# Patient Record
Sex: Male | Born: 1945 | Race: Black or African American | Hispanic: No | Smoking: Current every day smoker
Health system: Southern US, Community
[De-identification: ages and names within clinical notes are randomized; demographics above are authoritative.]

---

## 2007-08-27 ENCOUNTER — Emergency Department: Payer: Self-pay | Admitting: Emergency Medicine

## 2011-06-15 ENCOUNTER — Emergency Department: Payer: Self-pay | Admitting: Internal Medicine

## 2012-11-05 ENCOUNTER — Emergency Department: Payer: Self-pay | Admitting: Emergency Medicine

## 2012-12-01 ENCOUNTER — Emergency Department: Payer: Self-pay

## 2014-06-13 IMAGING — CT CT CERVICAL SPINE WITHOUT CONTRAST
1 series · 12 of 14 positions shown, 15 images · non-contrast
Comparison: none

REASON FOR EXAM: fall with +etoh
COMMENTS:

[Series 5: axial · axial · 0.33mm/px · z∈[+196,+347]mm · 12 of 108 slices shown, 15 images]
[im 9/108  soft-tissue]
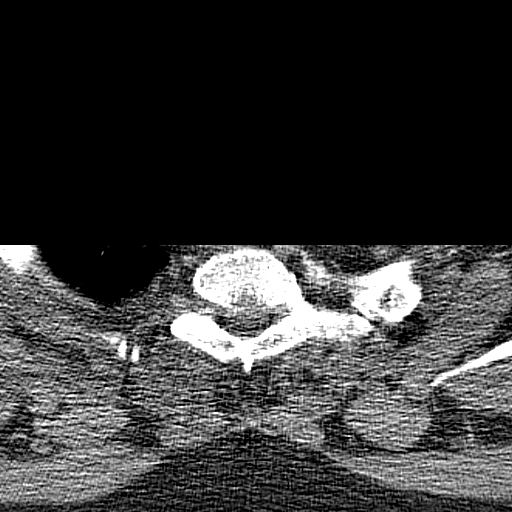
[im 9/108  bone]
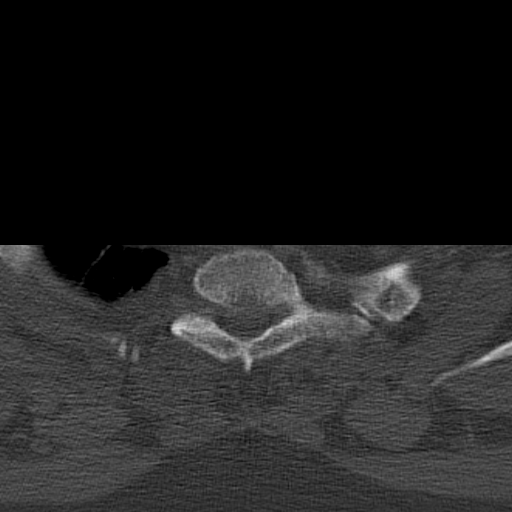
[im 17/108  bone]
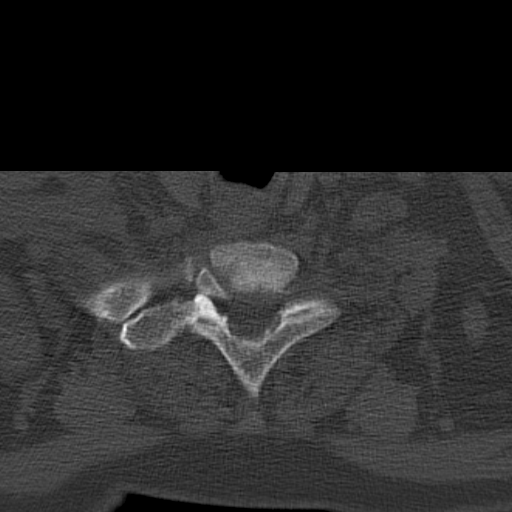
[im 25/108  bone]
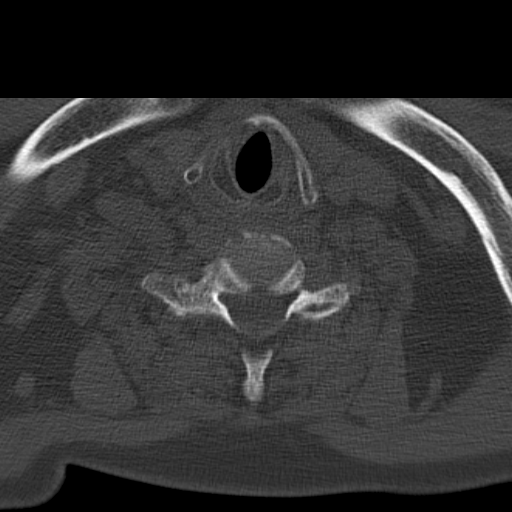
[im 33/108  bone]
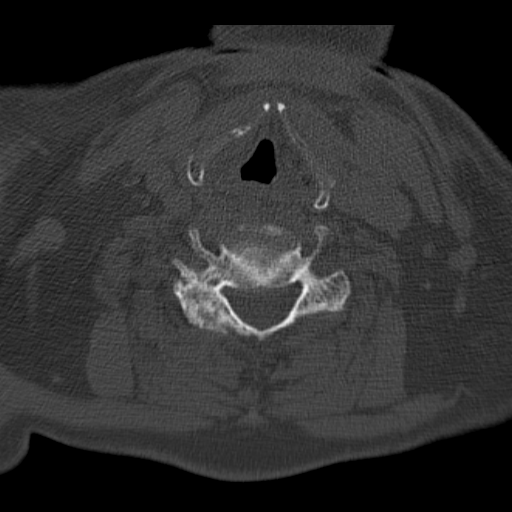
[im 42/108  soft-tissue]
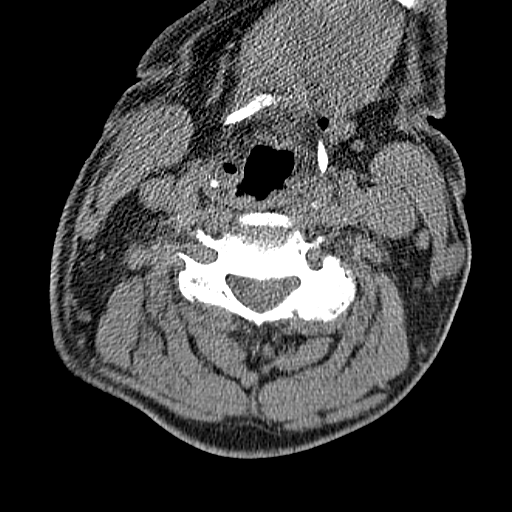
[im 42/108  bone]
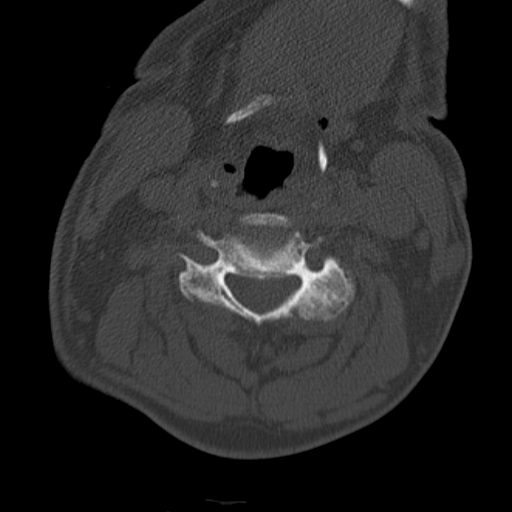
[im 50/108  bone]
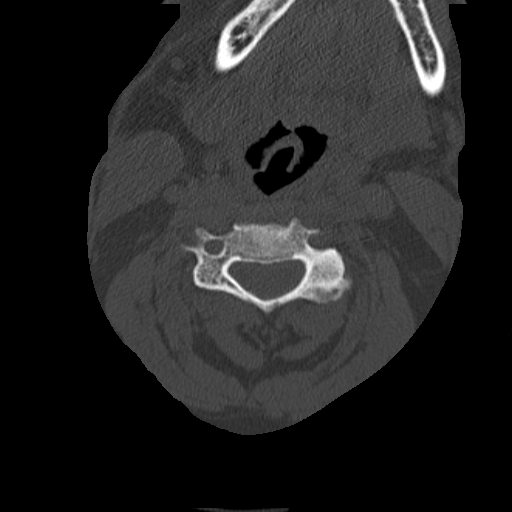
[im 58/108  bone]
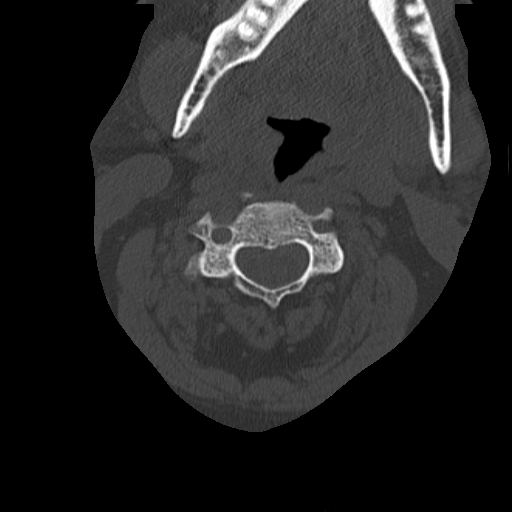
[im 66/108  bone]
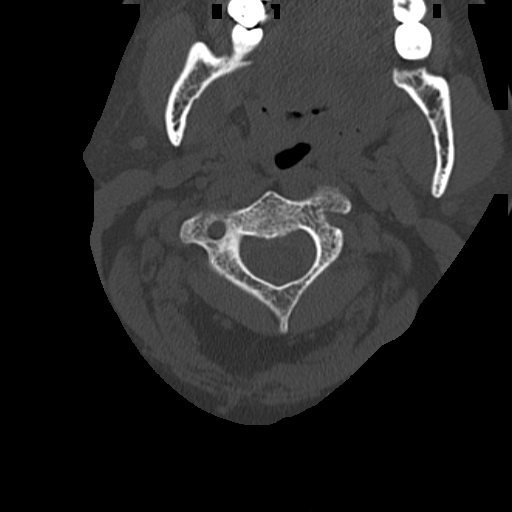
[im 75/108  soft-tissue]
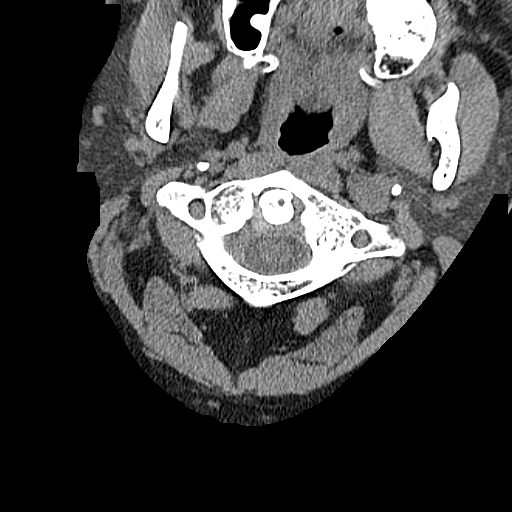
[im 75/108  bone]
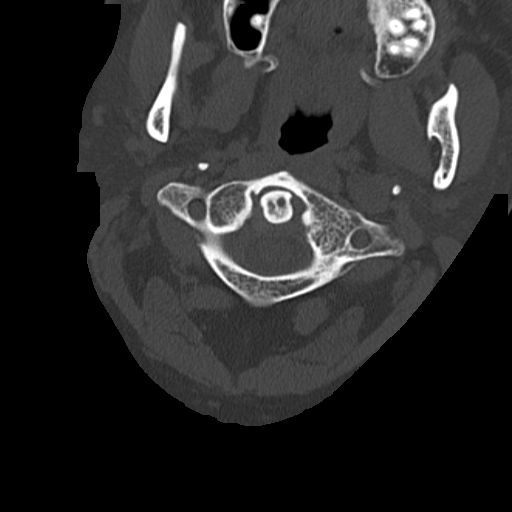
[im 83/108  bone]
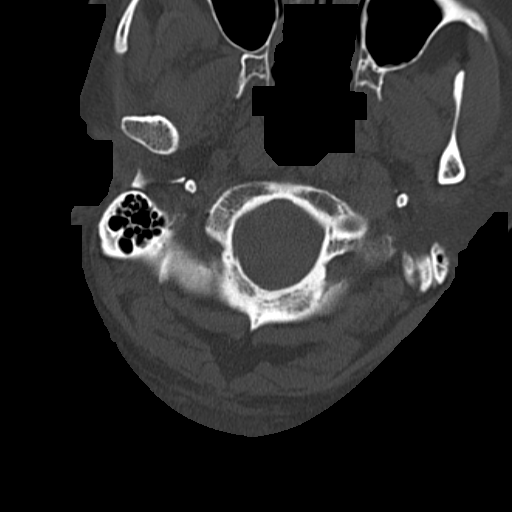
[im 91/108  bone]
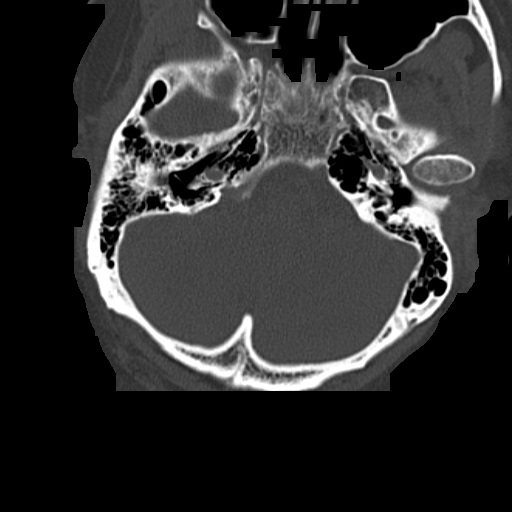
[im 99/108  bone]
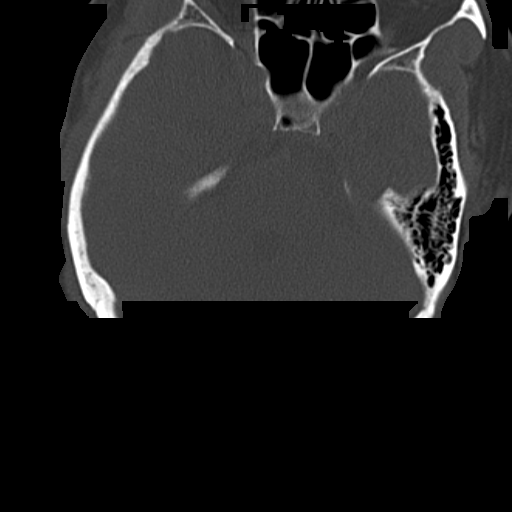

[12 of 14 positions shown; findings below may reference images not displayed]

PROCEDURE:     CT  - CT CERVICAL SPINE WO  - November 05, 2012  [DATE]

RESULT:     Multislice helical acquisition through the cervical spine is
reconstructed at 2 mm slice thickness in the axial, coronal and sagittal
planes at bone window setting. The patient has no previous similar study for
comparison.

The spinal alignment shows slight flexion of the cervical spine mild diffuse
degenerative changes including facet arthropathy including C4-C5 bilaterally
with some mild hypertrophic degenerative endplate spurring but no fracture,
dislocation or foreign body. The base of the calvarium appears to be
unremarkable.
IMPRESSION: No acute cervical spine bony abnormality. Mild degenerative
changes are present.

[REDACTED]

## 2016-05-08 ENCOUNTER — Telehealth: Payer: Self-pay

## 2017-10-04 ENCOUNTER — Emergency Department: Admission: EM | Admit: 2017-10-04 | Discharge: 2017-10-04 | Discharge status: Against Medical Advice | Payer: Self-pay

## 2017-10-04 NOTE — ED Notes (Signed)
Patient to stat desk reporting chest pain for several hours.  Taken to triage 3 and EKG performed.

## 2017-10-04 NOTE — ED Notes (Signed)
While awaiting triage patient states he does not want to seen and walked out.

## 2021-01-12 ENCOUNTER — Emergency Department: Payer: Medicare HMO

## 2021-01-12 ENCOUNTER — Emergency Department
Admission: EM | Admit: 2021-01-12 | Discharge: 2021-01-12 | Disposition: A | Discharge status: Home / Self Care | Payer: Medicare HMO | Attending: Emergency Medicine | Admitting: Emergency Medicine

## 2021-01-12 ENCOUNTER — Other Ambulatory Visit: Payer: Self-pay

## 2021-01-12 DIAGNOSIS — I700 Atherosclerosis of aorta: Secondary | ICD-10-CM | POA: Diagnosis not present

## 2021-01-12 DIAGNOSIS — J441 Chronic obstructive pulmonary disease with (acute) exacerbation: Secondary | ICD-10-CM | POA: Diagnosis not present

## 2021-01-12 DIAGNOSIS — J439 Emphysema, unspecified: Secondary | ICD-10-CM | POA: Diagnosis not present

## 2021-01-12 DIAGNOSIS — S2242XA Multiple fractures of ribs, left side, initial encounter for closed fracture: Secondary | ICD-10-CM | POA: Diagnosis not present

## 2021-01-12 DIAGNOSIS — F17210 Nicotine dependence, cigarettes, uncomplicated: Secondary | ICD-10-CM | POA: Diagnosis not present

## 2021-01-12 DIAGNOSIS — S299XXA Unspecified injury of thorax, initial encounter: Secondary | ICD-10-CM | POA: Diagnosis present

## 2021-01-12 DIAGNOSIS — R079 Chest pain, unspecified: Secondary | ICD-10-CM | POA: Diagnosis not present

## 2021-01-12 DIAGNOSIS — R0789 Other chest pain: Secondary | ICD-10-CM | POA: Diagnosis not present

## 2021-01-12 DIAGNOSIS — J189 Pneumonia, unspecified organism: Secondary | ICD-10-CM | POA: Diagnosis not present

## 2021-01-12 DIAGNOSIS — J9811 Atelectasis: Secondary | ICD-10-CM | POA: Diagnosis not present

## 2021-01-12 DIAGNOSIS — W19XXXA Unspecified fall, initial encounter: Secondary | ICD-10-CM

## 2021-01-12 DIAGNOSIS — R059 Cough, unspecified: Secondary | ICD-10-CM | POA: Insufficient documentation

## 2021-01-12 DIAGNOSIS — W010XXA Fall on same level from slipping, tripping and stumbling without subsequent striking against object, initial encounter: Secondary | ICD-10-CM | POA: Diagnosis not present

## 2021-01-12 DIAGNOSIS — R0781 Pleurodynia: Secondary | ICD-10-CM | POA: Diagnosis not present

## 2021-01-12 LAB — CBC
HCT: 42.4 % (ref 39.0–52.0)
Hemoglobin: 15 g/dL (ref 13.0–17.0)
MCH: 34.4 pg — ABNORMAL HIGH (ref 26.0–34.0)
MCHC: 35.4 g/dL (ref 30.0–36.0)
MCV: 97.2 fL (ref 80.0–100.0)
Platelets: 202 10*3/uL (ref 150–400)
RBC: 4.36 MIL/uL (ref 4.22–5.81)
RDW: 14.1 % (ref 11.5–15.5)
WBC: 9.9 10*3/uL (ref 4.0–10.5)
nRBC: 0 % (ref 0.0–0.2)

## 2021-01-12 LAB — TROPONIN I (HIGH SENSITIVITY)
Troponin I (High Sensitivity): 14 ng/L (ref <18)
Troponin I (High Sensitivity): 13 ng/L (ref <18)

## 2021-01-12 LAB — BASIC METABOLIC PANEL
Anion gap: 9 (ref 5–15)
BUN: 15 mg/dL (ref 8–23)
CO2: 23 mmol/L (ref 22–32)
Calcium: 8.2 mg/dL — ABNORMAL LOW (ref 8.9–10.3)
Chloride: 105 mmol/L (ref 98–111)
Creatinine, Ser: 0.59 mg/dL — ABNORMAL LOW (ref 0.61–1.24)
GFR, Estimated: >60 mL/min (ref >60)
Glucose, Bld: 104 mg/dL — ABNORMAL HIGH (ref 70–99)
Potassium: 3.4 mmol/L — ABNORMAL LOW (ref 3.5–5.1)
Sodium: 137 mmol/L (ref 135–145)

## 2021-01-12 MED ORDER — OXYCODONE HCL 5 MG PO TABS
5.0000 mg | ORAL_TABLET | Freq: Once | ORAL | Status: AC
  Administered 2021-01-12: 10:00:00 5 mg via ORAL
  Filled 2021-01-12: qty 1

## 2021-01-12 MED ORDER — ALBUTEROL SULFATE HFA 108 (90 BASE) MCG/ACT IN AERS: 2.0000 | INHALATION_SPRAY | Freq: Four times a day (QID) | RESPIRATORY_TRACT | 2 refills | Status: DC | PRN

## 2021-01-12 MED ORDER — DOXYCYCLINE HYCLATE 100 MG PO TABS
100.0000 mg | ORAL_TABLET | Freq: Once | ORAL | Status: AC
  Administered 2021-01-12: 11:00:00 100 mg via ORAL
  Filled 2021-01-12: qty 1

## 2021-01-12 MED ORDER — IPRATROPIUM-ALBUTEROL 0.5-2.5 (3) MG/3ML IN SOLN
3.0000 mL | Freq: Once | RESPIRATORY_TRACT | Status: AC
  Administered 2021-01-12: 11:00:00 3 mL via RESPIRATORY_TRACT
  Filled 2021-01-12: qty 3

## 2021-01-12 MED ORDER — PREDNISONE 20 MG PO TABS
60.0000 mg | ORAL_TABLET | Freq: Once | ORAL | Status: AC
  Administered 2021-01-12: 11:00:00 60 mg via ORAL
  Filled 2021-01-12: qty 3

## 2021-01-12 MED ORDER — DOXYCYCLINE HYCLATE 100 MG PO TABS: 100.0000 mg | ORAL_TABLET | Freq: Two times a day (BID) | ORAL | 0 refills | Status: AC

## 2021-01-12 MED ORDER — LIDOCAINE 5 % EX PTCH: 1.0000 | MEDICATED_PATCH | Freq: Two times a day (BID) | CUTANEOUS | 0 refills | Status: AC

## 2021-01-12 MED ORDER — METHOCARBAMOL 500 MG PO TABS: 500.0000 mg | ORAL_TABLET | Freq: Four times a day (QID) | ORAL | 0 refills | Status: DC | PRN

## 2021-01-12 MED ORDER — PREDNISONE 50 MG PO TABS: 50.0000 mg | ORAL_TABLET | Freq: Every day | ORAL | 0 refills | Status: AC

## 2021-01-12 MED ORDER — ACETAMINOPHEN 500 MG PO TABS
1000.0000 mg | ORAL_TABLET | Freq: Once | ORAL | Status: AC
  Administered 2021-01-12: 10:00:00 1000 mg via ORAL
  Filled 2021-01-12: qty 2

## 2021-01-12 NOTE — Discharge Instructions (Addendum)
You are being discharged with a prescription for doxycycline antibiotic to take 2 times daily for the next 5 days.   Prednisone steroids to take once daily for the next 4 days, to finish a 5-day course.  We gave you the dose for today already, start this tomorrow on Monday.  Albuterol inhaler to use as needed for any wheezing or shortness of breath.  Use 1 or 2 puffs at a time, every 4-6 hours.  For pain medicine, use the below regimen: Use Tylenol for pain and fevers.  Up to 1000 mg per dose, up to 4 times per day.  Do not take more than 4000 mg of Tylenol/acetaminophen within 24 hours..  Lidocaine patches.  Apply 1 patch at a time, leave on for 12 hours, remove for 12 hours prior to applying the next one.  Alternating on for 12 hours, off for 12 hours.  Methocarbamol/Robaxin muscle relaxers to use as needed on top of all of this for further severe pain.  Follow-up with your PCP in the next week to ensure you are healing appropriately.  Return to the ED with any worsening symptoms despite these measures.

## 2021-01-12 NOTE — ED Triage Notes (Addendum)
pt arrived to ed via pov with c/o cough starting Friday night. Pt reports having left sided rib pain when coughing. Denies sob, fever, n/v/d. NAD noted

## 2021-01-12 NOTE — ED Notes (Signed)
Pt taught how to use incentive spirometer  

## 2021-01-12 NOTE — ED Notes (Signed)
Patient transported to X-ray 

## 2021-01-12 NOTE — ED Provider Notes (Signed)
[facility] Emergency Department Provider Note ____________________________________________   Event Date/Time   First MD Initiated Contact with Patient 01/12/21 0840     (approximate)  I have reviewed the triage vital signs and the nursing notes.  HISTORY  Chief Complaint Cough and rib pain when coughing   HPI Jordan Washington is a 75 y.o. malewho presents to the ED for evaluation of rib pain and coughing.   Chart review indicates no relevant hx.   Patient presents to the ED for evaluation of left-sided rib/chest pain, cough.  Patient reports a mechanical fall, getting tripped up on his feet, that occurred 3 days ago.  He reports he fell onto his left side.  Denies syncope or head trauma, and reports he has been able to get up and get around since then, but with increasing pain to his left flank and chest wall.  He reports increased cough and increased sputum from baseline.  He reports cutting back on his cigarette smoking, but still smoking.  Who presents to the ED due to poor sleeping due to his left-sided chest wall pain.  History reviewed. No pertinent past medical history.  There are no problems to display for this patient.   History reviewed. No pertinent surgical history.  Prior to Admission medications   Medication Sig Start Date End Date Taking? Authorizing Provider  albuterol (VENTOLIN HFA) 108 (90 Base) MCG/ACT inhaler Inhale 2 puffs into the lungs every 6 (six) hours as needed for wheezing or shortness of breath. 01/12/21  Yes Delton Prairie, MD  doxycycline (VIBRA-TABS) 100 MG tablet Take 1 tablet (100 mg total) by mouth 2 (two) times daily for 5 days. 01/12/21 01/17/21 Yes Delton Prairie, MD  lidocaine (LIDODERM) 5 % Place 1 patch onto the skin every 12 (twelve) hours. Remove & Discard patch within 12 hours or as directed by MD 01/12/21 01/12/22 Yes Delton Prairie, MD  methocarbamol (ROBAXIN) 500 MG tablet Take 1 tablet (500 mg total) by mouth every 6  (six) hours as needed for muscle spasms (pain). 01/12/21  Yes Delton Prairie, MD  predniSONE (DELTASONE) 50 MG tablet Take 1 tablet (50 mg total) by mouth daily for 4 days. 01/12/21 01/16/21 Yes Delton Prairie, MD    Allergies Patient has no allergy information on record.  History reviewed. No pertinent family history.  Social History Social History   Tobacco Use   Smoking status: Every Day    Pack years: 0.00    Types: Cigarettes   Smokeless tobacco: Never  Substance Use Topics   Alcohol use: Yes    Alcohol/week: 2.0 standard drinks    Types: 2 Shots of liquor per week   Drug use: Never    Review of Systems  Constitutional: No fever/chills Eyes: No visual changes. ENT: No sore throat. Cardiovascular: Positive for traumatic left-sided chest pain. Respiratory: Positive for productive cough and shortness of breath. Gastrointestinal: No abdominal pain.  No nausea, no vomiting.  No diarrhea.  No constipation. Genitourinary: Negative for dysuria. Musculoskeletal: Negative for back pain. Skin: Negative for rash. Neurological: Negative for headaches, focal weakness or numbness.  ____________________________________________   PHYSICAL EXAM:  VITAL SIGNS: Vitals:   01/12/21 1100 01/12/21 1200  BP: (!) 137/107 (!) 154/114  Pulse: 74 75  Resp: 18 18  Temp:    SpO2: 96% 96%      Constitutional: Alert and oriented. Well appearing and in no acute distress. Eyes: Conjunctivae are normal. PERRL. EOMI. Head: Atraumatic. Nose: No congestion/rhinnorhea. Mouth/Throat: Mucous membranes  are moist.  Oropharynx non-erythematous. Neck: No stridor. No cervical spine tenderness to palpation. Cardiovascular: Normal rate, regular rhythm. Grossly normal heart sounds.  Good peripheral circulation. Respiratory: Minimal tachypnea to the low 20s, no further evidence of distress.  Diffuse and scattered expiratory wheezes with good improvement throughout. Gastrointestinal: Soft , nondistended,  nontender to palpation. No CVA tenderness. Musculoskeletal: No lower extremity tenderness nor edema.  No joint effusions.  Tenderness to palpation to the left-sided lateral chest wall without bony step-offs or external signs of trauma.  No evidence of open injury. No signs of trauma to the extremities. Neurologic:  Normal speech and language. No gross focal neurologic deficits are appreciated. No gait instability noted. Skin:  Skin is warm, dry and intact. No rash noted. Psychiatric: Mood and affect are normal. Speech and behavior are normal.  ____________________________________________   LABS (all labs ordered are listed, but only abnormal results are displayed)  Labs Reviewed  BASIC METABOLIC PANEL - Abnormal; Notable for the following components:      Result Value   Potassium 3.4 (*)    Glucose, Bld 104 (*)    Creatinine, Ser 0.59 (*)    Calcium 8.2 (*)    All other components within normal limits  CBC - Abnormal; Notable for the following components:   MCH 34.4 (*)    All other components within normal limits  TROPONIN I (HIGH SENSITIVITY)  TROPONIN I (HIGH SENSITIVITY)   ____________________________________________  12 Lead EKG  A. fib, rate of 101 bpm.  Normal axis and intervals.  No STEMI. ____________________________________________  RADIOLOGY  ED MD interpretation: 2 view CXR reviewed by me with streaky opacities to the left base.  Official radiology report(s): DG Chest 2 View  Result Date: 01/12/2021 CLINICAL DATA:  Left-sided rib pain with coughing EXAM: CHEST - 2 VIEW COMPARISON:  None. FINDINGS: Mild opacity overlapping the spine on the lateral view. No edema, effusion, or pneumothorax. Borderline heart size. Aortic tortuosity. IMPRESSION: 1. Mild left lower lobe infiltrate, suspicious for pneumonia. Recommend follow-up radiograph after any treatment. 2. No visible rib fracture. Electronically Signed   By: Marnee SpringJonathon  Watts M.D.   On: 01/12/2021 09:15   CT Chest  Wo Contrast  Result Date: 01/12/2021 CLINICAL DATA:  Fall with LEFT-sided rib pain. EXAM: CT CHEST WITHOUT CONTRAST TECHNIQUE: Multidetector CT imaging of the chest was performed following the standard protocol without IV contrast. COMPARISON:  None FINDINGS: Cardiovascular: Calcification of LEFT coronary circulation. Heart size mild to moderately enlarged. No pericardial effusion. Normal caliber of central pulmonary vessels. Ascending thoracic aorta, mildly dilated. Proximal arch proximally 3.7 cm. Descending thoracic aorta with smooth contours. Limited assessment of cardiovascular structures given lack of intravenous contrast. Mediastinum/Nodes: No adenopathy in the chest. Lungs/Pleura: Signs of pulmonary emphysema, centrilobular and worse at the upper lobes. Basilar atelectasis. Some subpleural reticulation, ground-glass and septal thickening may also be present at the lung bases. Some material in lower lobe bronchi particularly on the RIGHT. Scarring in the lingula. Upper Abdomen: Incidental imaging of upper abdominal contents with signs of hepatic steatosis and lobular hepatic contours. No acute findings relative to visualized liver, gallbladder, pancreas, spleen adrenal glands. Musculoskeletal: Mild spinal degenerative changes. Fractures of ribs 3 through 6 on the LEFT. Sternum is intact. IMPRESSION: 1. Minimally displaced LEFT sided rib fractures without pneumothorax. 2. Basilar atelectasis mixed with ground-glass subpleural reticulation with material in lower lobe bronchi. Findings could represent a combination of atelectasis and aspiration related changes. 3. Mild to moderate cardiomegaly. 4. Calcification of LEFT  coronary circulation. 5. Hepatic steatosis and lobular hepatic contours. Correlate with any clinical or laboratory evidence of liver disease. 6. Emphysema and aortic atherosclerosis. Aortic Atherosclerosis (ICD10-I70.0) and Emphysema (ICD10-J43.9). Electronically Signed   By: Donzetta Kohut M.D.    On: 01/12/2021 10:28    ____________________________________________   PROCEDURES and INTERVENTIONS  Procedure(s) performed (including Critical Care):  .1-3 Lead EKG Interpretation  Date/Time: 01/12/2021 1:31 PM Performed by: Delton Prairie, MD Authorized by: Delton Prairie, MD    Medications  acetaminophen (TYLENOL) tablet 1,000 mg (1,000 mg Oral Given 01/12/21 0943)  oxyCODONE (Oxy IR/ROXICODONE) immediate release tablet 5 mg (5 mg Oral Given 01/12/21 0943)  doxycycline (VIBRA-TABS) tablet 100 mg (100 mg Oral Given 01/12/21 1111)  predniSONE (DELTASONE) tablet 60 mg (60 mg Oral Given 01/12/21 1111)  ipratropium-albuterol (DUONEB) 0.5-2.5 (3) MG/3ML nebulizer solution 3 mL (3 mLs Nebulization Given 01/12/21 1112)    ____________________________________________   MDM / ED COURSE   75 year old smoker presents to the ED with chest pain or shortness of breath, likely due to COP exacerbation and rib fractures, ultimately amenable to trial of outpatient management.  Normal vitals on room air.  The patient with minimal tachypnea and expiratory wheezes, but no distress and largely looks pretty well.  He is tender to palpation over his left lateral ribs, no overlying skin changes, evidence of open injury or herpes zoster.  X-ray with possible basilar infiltrate, and of his increased sputum production from baseline, they would benefit from a short course of antibiotics and he was started on a course of doxycycline.  Improving symptoms after DuoNeb and steroids.  CT chest confirms for nondisplaced broken ribs in the left after his fall.  He is pulling 1500 on incentive spirometry and is very eager to go home.  I see no barriers to trial of outpatient management.  Will discharge with prescriptions for symptomatic control and we discussed return precautions for the ED.  Clinical Course as of 01/12/21 1331  Sun Jan 12, 2021  1105 Reassessed.  Patient reports slightly improving pain after oral analgesia.   We discussed rib fractures, COPD exacerbation and infiltration with increased sputum production.  We discussed breathing treatments, steroids and antibiotics.  He reports that he is very eager to go home and does not want to stay in the hospital.  I think it is reasonable to attempt a trial of outpatient management if we can get him feeling better. [DS]  1218 Reassessed.  Patient sitting up on the side of the bed and looks well.  His son is at the bedside.  He reports he is eager to go home.  We discussed home care with inhaler, steroids for his COPD, we discussed doxycycline for pneumonia and increased sputum production, we discussed incentive spirometry for his rib fractures and management of pain. [DS]    Clinical Course User Index [DS] Delton Prairie, MD    ____________________________________________   FINAL CLINICAL IMPRESSION(S) / ED DIAGNOSES  Final diagnoses:  Closed fracture of multiple ribs of left side, initial encounter  COPD exacerbation (HCC)  Fall, initial encounter     ED Discharge Orders          Ordered    albuterol (VENTOLIN HFA) 108 (90 Base) MCG/ACT inhaler  Every 6 hours PRN        01/12/21 1222    predniSONE (DELTASONE) 50 MG tablet  Daily        01/12/21 1222    doxycycline (VIBRA-TABS) 100 MG tablet  2 times daily  01/12/21 1222    lidocaine (LIDODERM) 5 %  Every 12 hours        01/12/21 1222    methocarbamol (ROBAXIN) 500 MG tablet  Every 6 hours PRN        01/12/21 1222             Kaisyn Reinhold   Note:  This document was prepared using Conservation officer, historic buildings and may include unintentional dictation errors.    Delton Prairie, MD 01/12/21 [phone]

## 2021-01-12 NOTE — ED Notes (Signed)
Patient transported to CT 

## 2022-03-01 ENCOUNTER — Other Ambulatory Visit: Payer: Self-pay

## 2022-03-01 ENCOUNTER — Observation Stay
Admission: EM | Admit: 2022-03-01 | Discharge: 2022-03-02 | Discharge status: Against Medical Advice | Payer: No Typology Code available for payment source | Attending: Emergency Medicine | Admitting: Emergency Medicine

## 2022-03-01 ENCOUNTER — Encounter: Payer: Self-pay | Admitting: Radiology

## 2022-03-01 ENCOUNTER — Emergency Department: Payer: No Typology Code available for payment source

## 2022-03-01 DIAGNOSIS — Z720 Tobacco use: Secondary | ICD-10-CM

## 2022-03-01 DIAGNOSIS — E876 Hypokalemia: Secondary | ICD-10-CM | POA: Insufficient documentation

## 2022-03-01 DIAGNOSIS — I10 Essential (primary) hypertension: Secondary | ICD-10-CM | POA: Insufficient documentation

## 2022-03-01 DIAGNOSIS — F10929 Alcohol use, unspecified with intoxication, unspecified: Secondary | ICD-10-CM

## 2022-03-01 DIAGNOSIS — R4182 Altered mental status, unspecified: Secondary | ICD-10-CM

## 2022-03-01 DIAGNOSIS — R410 Disorientation, unspecified: Secondary | ICD-10-CM | POA: Diagnosis not present

## 2022-03-01 DIAGNOSIS — I639 Cerebral infarction, unspecified: Principal | ICD-10-CM | POA: Insufficient documentation

## 2022-03-01 DIAGNOSIS — I69920 Aphasia following unspecified cerebrovascular disease: Secondary | ICD-10-CM | POA: Diagnosis not present

## 2022-03-01 DIAGNOSIS — G4733 Obstructive sleep apnea (adult) (pediatric): Secondary | ICD-10-CM

## 2022-03-01 DIAGNOSIS — R479 Unspecified speech disturbances: Secondary | ICD-10-CM | POA: Diagnosis not present

## 2022-03-01 DIAGNOSIS — Z20822 Contact with and (suspected) exposure to covid-19: Secondary | ICD-10-CM | POA: Diagnosis not present

## 2022-03-01 DIAGNOSIS — F17210 Nicotine dependence, cigarettes, uncomplicated: Secondary | ICD-10-CM | POA: Insufficient documentation

## 2022-03-01 DIAGNOSIS — I63341 Cerebral infarction due to thrombosis of right cerebellar artery: Secondary | ICD-10-CM

## 2022-03-01 LAB — CBC
HCT: 45.3 % (ref 39.0–52.0)
Hemoglobin: 15 g/dL (ref 13.0–17.0)
MCH: 31.6 pg (ref 26.0–34.0)
MCHC: 33.1 g/dL (ref 30.0–36.0)
MCV: 95.6 fL (ref 80.0–100.0)
Platelets: 147 10*3/uL — ABNORMAL LOW (ref 150–400)
RBC: 4.74 MIL/uL (ref 4.22–5.81)
RDW: 13.8 % (ref 11.5–15.5)
WBC: 8.4 10*3/uL (ref 4.0–10.5)
nRBC: 0 % (ref 0.0–0.2)

## 2022-03-01 LAB — CBC WITH DIFFERENTIAL/PLATELET
Abs Immature Granulocytes: 0.02 10*3/uL (ref 0.00–0.07)
Basophils Absolute: 0 10*3/uL (ref 0.0–0.1)
Basophils Relative: 0 %
Eosinophils Absolute: 0 10*3/uL (ref 0.0–0.5)
Eosinophils Relative: 1 %
HCT: 42.8 % (ref 39.0–52.0)
Hemoglobin: 14.3 g/dL (ref 13.0–17.0)
Immature Granulocytes: 0 %
Lymphocytes Relative: 27 %
Lymphs Abs: 2 10*3/uL (ref 0.7–4.0)
MCH: 32.1 pg (ref 26.0–34.0)
MCHC: 33.4 g/dL (ref 30.0–36.0)
MCV: 96 fL (ref 80.0–100.0)
Monocytes Absolute: 0.7 10*3/uL (ref 0.1–1.0)
Monocytes Relative: 10 %
Neutro Abs: 4.5 10*3/uL (ref 1.7–7.7)
Neutrophils Relative %: 62 %
Platelets: 124 10*3/uL — ABNORMAL LOW (ref 150–400)
RBC: 4.46 MIL/uL (ref 4.22–5.81)
RDW: 13.8 % (ref 11.5–15.5)
WBC: 7.2 10*3/uL (ref 4.0–10.5)
nRBC: 0 % (ref 0.0–0.2)

## 2022-03-01 LAB — COMPREHENSIVE METABOLIC PANEL WITH GFR
ALT: 63 U/L — ABNORMAL HIGH (ref 0–44)
AST: 83 U/L — ABNORMAL HIGH (ref 15–41)
Albumin: 3.5 g/dL (ref 3.5–5.0)
Alkaline Phosphatase: 64 U/L (ref 38–126)
Anion gap: 9 (ref 5–15)
BUN: 13 mg/dL (ref 8–23)
CO2: 21 mmol/L — ABNORMAL LOW (ref 22–32)
Calcium: 8.5 mg/dL — ABNORMAL LOW (ref 8.9–10.3)
Chloride: 108 mmol/L (ref 98–111)
Creatinine, Ser: 0.65 mg/dL (ref 0.61–1.24)
GFR, Estimated: >60 mL/min
Glucose, Bld: 91 mg/dL (ref 70–99)
Potassium: 3.1 mmol/L — ABNORMAL LOW (ref 3.5–5.1)
Sodium: 138 mmol/L (ref 135–145)
Total Bilirubin: 1.1 mg/dL (ref 0.3–1.2)
Total Protein: 9.3 g/dL — ABNORMAL HIGH (ref 6.5–8.1)

## 2022-03-01 LAB — URINALYSIS, ROUTINE W REFLEX MICROSCOPIC
Glucose, UA: NEGATIVE mg/dL
Hgb urine dipstick: NEGATIVE
Ketones, ur: NEGATIVE mg/dL
Leukocytes,Ua: NEGATIVE
Nitrite: NEGATIVE
Protein, ur: 30 mg/dL — AB
Specific Gravity, Urine: 1.026 (ref 1.005–1.030)
pH: 5 (ref 5.0–8.0)

## 2022-03-01 LAB — URINE DRUG SCREEN, QUALITATIVE (ARMC ONLY)
Amphetamines, Ur Screen: NOT DETECTED
Barbiturates, Ur Screen: NOT DETECTED
Benzodiazepine, Ur Scrn: NOT DETECTED
Cannabinoid 50 Ng, Ur ~~LOC~~: NOT DETECTED
Cocaine Metabolite,Ur ~~LOC~~: NOT DETECTED
MDMA (Ecstasy)Ur Screen: NOT DETECTED
Methadone Scn, Ur: NOT DETECTED
Opiate, Ur Screen: NOT DETECTED
Phencyclidine (PCP) Ur S: NOT DETECTED
Tricyclic, Ur Screen: NOT DETECTED

## 2022-03-01 LAB — PROTIME-INR
INR: 1.1 (ref 0.8–1.2)
Prothrombin Time: 13.8 seconds (ref 11.4–15.2)

## 2022-03-01 LAB — ETHANOL: Alcohol, Ethyl (B): 93 mg/dL — ABNORMAL HIGH (ref <10)

## 2022-03-01 LAB — APTT: aPTT: 30 seconds (ref 24–36)

## 2022-03-01 MED ORDER — POTASSIUM CHLORIDE CRYS ER 20 MEQ PO TBCR
40.0000 meq | EXTENDED_RELEASE_TABLET | Freq: Once | ORAL | Status: AC
  Administered 2022-03-01: 40 meq via ORAL
  Filled 2022-03-01: qty 2

## 2022-03-01 NOTE — ED Triage Notes (Signed)
Pt via POV from home. Family here pt has been more confusing and forgetting stuff, family thinks he may have a UTI. States this has been going on for the past week. Pt also c/o L lower dental pain. Pt is A&OX4 and NAD

## 2022-03-01 NOTE — ED Provider Notes (Signed)
[facility] Emergency Department Provider Note     Event Date/Time   First MD Initiated Contact with Patient 03/01/22 1958     (approximate)   History   Altered Mental Status   HPI  Jordan Washington is a 76 y.o. male who according to review of the patient's [facility] summary via care everywhere, has a history of essential hypertension, alcohol disorder, posttraumatic stress disorder, hepatitis C, GERD, and obstructive sleep apnea, presents to the ED out of concern voiced by his adult niece and nephew.  Patient lives independently, and according to family members, over the last two weeks has seemed to have some increased confusion and altered mental status.  They specifically describe him having episodes of word searching, as well as not making sense on the telephone, including slurred speech.  The patient would himself endorse feeling strange for at least the last week as well.  The patient admits to being a current everyday smoker and drinker, but is unable to quantify how much alcohol he takes.  His primary complaint today has been some acute pain to a chronically broken left lower molar.  He denies any fevers, chills, sweats, chest pain, shortness of breath.  He also denies any numbness, tingling, or paralysis.  He does admit to a fall at home couple days ago but denies any serious head injury related to it.  The family present the patient to the ED with concern for a possible UTI or urosepsis.  Patient however, lives independently and has no history of urinary incontinence or mobility issues which would otherwise raise a concern for urosepsis.  Physical Exam   Triage Vital Signs: ED Triage Vitals  Enc Vitals Group     BP 03/01/22 1836 120/83     Pulse Rate 03/01/22 1836 70     Resp 03/01/22 1836 18     Temp 03/01/22 1836 98.3 F (36.8 C)     Temp Source 03/01/22 1836 Oral     SpO2 03/01/22 1836 94 %     Weight 03/01/22 1834 280 lb (127 kg)      Height 03/01/22 1834 6\' 1"  (1.854 m)     Head Circumference --      Peak Flow --      Pain Score 03/01/22 1834 10     Pain Loc --      Pain Edu? --      Excl. in GC? --     Most recent vital signs: Vitals:   03/01/22 1836  BP: 120/83  Pulse: 70  Resp: 18  Temp: 98.3 F (36.8 C)  SpO2: 94%    General Awake, no distress. NAD. Alert to person, place, and time.  HEENT NCAT. PERRL. EOMI. No rhinorrhea. Mucous membranes are moist.  Uvula is midline tonsils are flat.  Patient with a chronically fractured left lower second molar without any surrounding erythema or induration.  No brawny sublingual edema is noted. CV:  Good peripheral perfusion.  RESP:  Normal effort.  ABD:  No distention. Soft, nontender. Mild hepatomegaly.  Rigidity, guarding, or rebound.  Normal bowel sounds appreciated. NEURO: CN II- grossly intact.    ED Results / Procedures / Treatments   Labs (all labs ordered are listed, but only abnormal results are displayed) Labs Reviewed  COMPREHENSIVE METABOLIC PANEL - Abnormal; Notable for the following components:      Result Value   Potassium 3.1 (*)    CO2 21 (*)    Calcium  8.5 (*)    Total Protein 9.3 (*)    AST 83 (*)    ALT 63 (*)    All other components within normal limits  CBC - Abnormal; Notable for the following components:   Platelets 147 (*)    All other components within normal limits  URINALYSIS, ROUTINE W REFLEX MICROSCOPIC - Abnormal; Notable for the following components:   Color, Urine AMBER (*)    APPearance HAZY (*)    Bilirubin Urine SMALL (*)    Protein, ur 30 (*)    Bacteria, UA RARE (*)    All other components within normal limits  ETHANOL - Abnormal; Notable for the following components:   Alcohol, Ethyl (B) 93 (*)    All other components within normal limits  CBC WITH DIFFERENTIAL/PLATELET - Abnormal; Notable for the following components:   Platelets 124 (*)    All other components within normal limits  RESP PANEL BY RT-PCR (FLU  A&B, COVID) ARPGX2  URINE DRUG SCREEN, QUALITATIVE ([facility] ONLY)  PROTIME-INR  APTT  DIFFERENTIAL  CBG MONITORING, ED     EKG  Vent. rate 70 BPM PR interval * ms QRS duration 88 ms QT/QTcB 448/483 ms P-R-T axes 174 57 30  RADIOLOGY  I personally viewed and evaluated these images as part of my medical decision making, as well as reviewing the written report by the radiologist.  ED Provider Interpretation: age indeterminate infarcts  CT HEAD WO CONTRAST ( )  Result Date: 03/01/2022 CLINICAL DATA:  Altered mental status EXAM: CT HEAD WITHOUT CONTRAST TECHNIQUE: Contiguous axial images were obtained from the base of the skull through the vertex without intravenous contrast. RADIATION DOSE REDUCTION: This exam was performed according to the departmental dose-optimization program which includes automated exposure control, adjustment of the mA and/or kV according to patient size and/or use of iterative reconstruction technique. COMPARISON:  11/05/2012 FINDINGS: Brain: No signs of bleeding within the cranium are noted. There is interval appearance of low attenuation in left temporoparietal and left occipital cortex. Cortical sulci are prominent. There is decreased density in periventricular white matter. There is no significant focal mass effect. Vascular: Unremarkable. Skull: Unremarkable. Sinuses/Orbits: Unremarkable. Other: None. IMPRESSION: There is interval appearance of moderate to large area of decreased density in the left temporoparietal cortex. There is new small area of low attenuation in the left occipital cortex. 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 Follow-up MRI as clinically warranted should be considered. There are no signs of bleeding within the cranium. Atrophy. Small-vessel disease. Electronically Signed   By: Ernie Avena M.D.   On: 03/01/2022 21:06     PROCEDURES:  Critical Care performed: No  Procedures   MEDICATIONS ORDERED IN ED: Medications   potassium chloride SA (KLOR-CON M) CR tablet 40 mEq (40 mEq Oral Given 03/01/22 2049)     IMPRESSION / MDM / ASSESSMENT AND PLAN / ED COURSE  I reviewed the triage vital signs and the nursing notes.                              Differential diagnosis includes, but is not limited to, alcohol, illicit or prescription medications, or other toxic ingestion; intracranial pathology such as stroke or intracerebral hemorrhage; fever or infectious causes including sepsis; hypoxemia and/or hypercarbia; uremia; trauma; endocrine related disorders such as diabetes, hypoglycemia, and thyroid-related diseases; hypertensive encephalopathy; etc.  Patient's presentation is most consistent with acute complicated illness / injury requiring diagnostic workup.  ----------------------------------------- 11:39 PM  on 03/01/2022 ----------------------------------------- Transferred care of this patient to my attending Dr. Lenard Lance, he will follow the MRI reports, and dispo the patient accordingly.    FINAL CLINICAL IMPRESSION(S) / ED DIAGNOSES   Final diagnoses:  Altered mental status, unspecified altered mental status type     Rx / DC Orders   ED Discharge Orders     None        Note:  This document was prepared using Dragon voice recognition software and may include unintentional dictation errors.    Lissa Hoard, PA-C 03/02/22 0001    Minna Antis, MD 03/02/22 0430

## 2022-03-02 ENCOUNTER — Observation Stay: Admit: 2022-03-02 | Payer: No Typology Code available for payment source

## 2022-03-02 ENCOUNTER — Encounter: Payer: Self-pay | Admitting: Physician Assistant

## 2022-03-02 ENCOUNTER — Other Ambulatory Visit: Payer: Self-pay | Admitting: Neurology

## 2022-03-02 DIAGNOSIS — R410 Disorientation, unspecified: Secondary | ICD-10-CM | POA: Diagnosis not present

## 2022-03-02 DIAGNOSIS — E876 Hypokalemia: Secondary | ICD-10-CM

## 2022-03-02 DIAGNOSIS — I671 Cerebral aneurysm, nonruptured: Secondary | ICD-10-CM | POA: Diagnosis not present

## 2022-03-02 DIAGNOSIS — R7401 Elevation of levels of liver transaminase levels: Secondary | ICD-10-CM | POA: Diagnosis not present

## 2022-03-02 DIAGNOSIS — F10920 Alcohol use, unspecified with intoxication, uncomplicated: Secondary | ICD-10-CM | POA: Diagnosis not present

## 2022-03-02 DIAGNOSIS — I63341 Cerebral infarction due to thrombosis of right cerebellar artery: Secondary | ICD-10-CM

## 2022-03-02 DIAGNOSIS — I639 Cerebral infarction, unspecified: Secondary | ICD-10-CM | POA: Diagnosis present

## 2022-03-02 DIAGNOSIS — G9340 Encephalopathy, unspecified: Secondary | ICD-10-CM | POA: Diagnosis not present

## 2022-03-02 DIAGNOSIS — I4891 Unspecified atrial fibrillation: Secondary | ICD-10-CM | POA: Diagnosis not present

## 2022-03-02 DIAGNOSIS — I77810 Thoracic aortic ectasia: Secondary | ICD-10-CM | POA: Diagnosis not present

## 2022-03-02 DIAGNOSIS — F10929 Alcohol use, unspecified with intoxication, unspecified: Secondary | ICD-10-CM | POA: Diagnosis not present

## 2022-03-02 DIAGNOSIS — R479 Unspecified speech disturbances: Secondary | ICD-10-CM | POA: Diagnosis not present

## 2022-03-02 DIAGNOSIS — G4733 Obstructive sleep apnea (adult) (pediatric): Secondary | ICD-10-CM

## 2022-03-02 DIAGNOSIS — I351 Nonrheumatic aortic (valve) insufficiency: Secondary | ICD-10-CM | POA: Diagnosis not present

## 2022-03-02 DIAGNOSIS — I6359 Cerebral infarction due to unspecified occlusion or stenosis of other cerebral artery: Secondary | ICD-10-CM | POA: Diagnosis not present

## 2022-03-02 DIAGNOSIS — F17210 Nicotine dependence, cigarettes, uncomplicated: Secondary | ICD-10-CM | POA: Diagnosis not present

## 2022-03-02 DIAGNOSIS — I6389 Other cerebral infarction: Secondary | ICD-10-CM | POA: Diagnosis not present

## 2022-03-02 DIAGNOSIS — B1920 Unspecified viral hepatitis C without hepatic coma: Secondary | ICD-10-CM | POA: Diagnosis not present

## 2022-03-02 DIAGNOSIS — I38 Endocarditis, valve unspecified: Secondary | ICD-10-CM | POA: Diagnosis not present

## 2022-03-02 DIAGNOSIS — D696 Thrombocytopenia, unspecified: Secondary | ICD-10-CM | POA: Diagnosis not present

## 2022-03-02 DIAGNOSIS — I4892 Unspecified atrial flutter: Secondary | ICD-10-CM | POA: Diagnosis not present

## 2022-03-02 DIAGNOSIS — J189 Pneumonia, unspecified organism: Secondary | ICD-10-CM | POA: Diagnosis not present

## 2022-03-02 DIAGNOSIS — R4701 Aphasia: Secondary | ICD-10-CM | POA: Diagnosis not present

## 2022-03-02 DIAGNOSIS — M1612 Unilateral primary osteoarthritis, left hip: Secondary | ICD-10-CM | POA: Diagnosis not present

## 2022-03-02 DIAGNOSIS — I69920 Aphasia following unspecified cerebrovascular disease: Secondary | ICD-10-CM | POA: Diagnosis not present

## 2022-03-02 DIAGNOSIS — Z720 Tobacco use: Secondary | ICD-10-CM

## 2022-03-02 DIAGNOSIS — R4182 Altered mental status, unspecified: Secondary | ICD-10-CM | POA: Diagnosis not present

## 2022-03-02 DIAGNOSIS — G8191 Hemiplegia, unspecified affecting right dominant side: Secondary | ICD-10-CM | POA: Diagnosis not present

## 2022-03-02 DIAGNOSIS — R932 Abnormal findings on diagnostic imaging of liver and biliary tract: Secondary | ICD-10-CM | POA: Diagnosis not present

## 2022-03-02 DIAGNOSIS — I63512 Cerebral infarction due to unspecified occlusion or stenosis of left middle cerebral artery: Secondary | ICD-10-CM | POA: Diagnosis not present

## 2022-03-02 DIAGNOSIS — F10129 Alcohol abuse with intoxication, unspecified: Secondary | ICD-10-CM | POA: Diagnosis not present

## 2022-03-02 DIAGNOSIS — I517 Cardiomegaly: Secondary | ICD-10-CM | POA: Diagnosis not present

## 2022-03-02 LAB — BASIC METABOLIC PANEL
Anion gap: 4 — ABNORMAL LOW (ref 5–15)
BUN: 11 mg/dL (ref 8–23)
CO2: 24 mmol/L (ref 22–32)
Calcium: 8.2 mg/dL — ABNORMAL LOW (ref 8.9–10.3)
Chloride: 110 mmol/L (ref 98–111)
Creatinine, Ser: 0.74 mg/dL (ref 0.61–1.24)
GFR, Estimated: >60 mL/min (ref >60)
Glucose, Bld: 92 mg/dL (ref 70–99)
Potassium: 4 mmol/L (ref 3.5–5.1)
Sodium: 138 mmol/L (ref 135–145)

## 2022-03-02 LAB — CBC
HCT: 39.9 % (ref 39.0–52.0)
Hemoglobin: 13.4 g/dL (ref 13.0–17.0)
MCH: 31.8 pg (ref 26.0–34.0)
MCHC: 33.6 g/dL (ref 30.0–36.0)
MCV: 94.8 fL (ref 80.0–100.0)
Platelets: 111 10*3/uL — ABNORMAL LOW (ref 150–400)
RBC: 4.21 MIL/uL — ABNORMAL LOW (ref 4.22–5.81)
RDW: 13.6 % (ref 11.5–15.5)
WBC: 6.1 10*3/uL (ref 4.0–10.5)
nRBC: 0 % (ref 0.0–0.2)

## 2022-03-02 LAB — VITAMIN B12: Vitamin B-12: 238 pg/mL (ref 180–914)

## 2022-03-02 LAB — RESP PANEL BY RT-PCR (FLU A&B, COVID) ARPGX2
Influenza A by PCR: NEGATIVE
Influenza B by PCR: NEGATIVE
SARS Coronavirus 2 by RT PCR: NEGATIVE

## 2022-03-02 LAB — LIPID PANEL
Cholesterol: 155 mg/dL (ref 0–200)
HDL: 36 mg/dL — ABNORMAL LOW (ref >40)
LDL Cholesterol: 110 mg/dL — ABNORMAL HIGH (ref 0–99)
Total CHOL/HDL Ratio: 4.3 RATIO
Triglycerides: 44 mg/dL (ref <150)
VLDL: 9 mg/dL (ref 0–40)

## 2022-03-02 LAB — HEMOGLOBIN A1C
Hgb A1c MFr Bld: 4.7 % — ABNORMAL LOW (ref 4.8–5.6)
Mean Plasma Glucose: 88.19 mg/dL

## 2022-03-02 LAB — TSH: TSH: 1.399 u[IU]/mL (ref 0.350–4.500)

## 2022-03-02 MED ORDER — ASPIRIN 81 MG PO TBEC
81.0000 mg | DELAYED_RELEASE_TABLET | Freq: Every day | ORAL | Status: DC
  Administered 2022-03-02: 81 mg via ORAL
  Filled 2022-03-02: qty 1

## 2022-03-02 MED ORDER — CLOPIDOGREL BISULFATE 75 MG PO TABS
75.0000 mg | ORAL_TABLET | Freq: Every day | ORAL | Status: DC
  Administered 2022-03-02: 75 mg via ORAL
  Filled 2022-03-02: qty 1

## 2022-03-02 MED ORDER — TRAZODONE HCL 50 MG PO TABS: 25.0000 mg | ORAL_TABLET | Freq: Every evening | ORAL | Status: DC | PRN

## 2022-03-02 MED ORDER — ONDANSETRON HCL 4 MG PO TABS: 4.0000 mg | ORAL_TABLET | Freq: Four times a day (QID) | ORAL | Status: DC | PRN

## 2022-03-02 MED ORDER — ATORVASTATIN CALCIUM 20 MG PO TABS: 80.0000 mg | ORAL_TABLET | Freq: Every day | ORAL | Status: DC

## 2022-03-02 MED ORDER — MAGNESIUM HYDROXIDE 400 MG/5ML PO SUSP: 30.0000 mL | Freq: Every day | ORAL | Status: DC | PRN

## 2022-03-02 MED ORDER — ACETAMINOPHEN 325 MG PO TABS: 650.0000 mg | ORAL_TABLET | Freq: Four times a day (QID) | ORAL | Status: DC | PRN

## 2022-03-02 MED ORDER — ENOXAPARIN SODIUM 80 MG/0.8ML IJ SOSY
0.5000 mg/kg | PREFILLED_SYRINGE | INTRAMUSCULAR | Status: DC
  Administered 2022-03-02: 62.5 mg via SUBCUTANEOUS
  Filled 2022-03-02: qty 0.63

## 2022-03-02 MED ORDER — POTASSIUM CHLORIDE IN NACL 20-0.9 MEQ/L-% IV SOLN
INTRAVENOUS | Status: DC
  Filled 2022-03-02: qty 1000

## 2022-03-02 MED ORDER — ONDANSETRON HCL 4 MG/2ML IJ SOLN: 4.0000 mg | Freq: Four times a day (QID) | INTRAMUSCULAR | Status: DC | PRN

## 2022-03-02 MED ORDER — STROKE: EARLY STAGES OF RECOVERY BOOK: Freq: Once | Status: DC

## 2022-03-02 MED ORDER — THIAMINE MONONITRATE 100 MG PO TABS
100.0000 mg | ORAL_TABLET | Freq: Every day | ORAL | Status: DC
  Administered 2022-03-02: 100 mg via ORAL
  Filled 2022-03-02: qty 1

## 2022-03-02 MED ORDER — ACETAMINOPHEN 325 MG RE SUPP: 650.0000 mg | Freq: Four times a day (QID) | RECTAL | Status: DC | PRN

## 2022-03-02 MED ORDER — ATORVASTATIN CALCIUM 20 MG PO TABS
20.0000 mg | ORAL_TABLET | Freq: Every day | ORAL | Status: DC
  Administered 2022-03-02: 20 mg via ORAL
  Filled 2022-03-02: qty 1

## 2022-03-02 NOTE — Assessment & Plan Note (Addendum)
- 

## 2022-03-02 NOTE — Progress Notes (Signed)
OT Cancellation Note  Patient Details Name: FINNEUS KANESHIRO MRN: [id number] DOB: 12/15/1945   Cancelled Treatment:    Reason Eval/Treat Not Completed: OT screened, no needs identified, will sign off. OT order received and chart reviewed. RN reports pt has been ambulating at mod I level with use of SPC in ED hallway. Pt also took self to bathroom for BM and managed clothing and hygiene without assistance. Pt reports returning to baseline level of care and declines OT evaluation at this time. OT to complete order.   Jackquline Denmark, MS, OTR/L , CBIS ascom [phone]  03/02/22, 10:39 AM

## 2022-03-02 NOTE — Assessment & Plan Note (Addendum)
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 MRI of the brain showed evolving subacute ischemic infarct involving the left temporoccipital cortex, with additional small subacute left occipital cortical infarct. Associated petechial hemorrhage and/or laminar necrosis without frank hemorrhagic transformation or significant regional mass effect. Underlying age-related cerebral atrophy with moderate chronic microvascular ischemic disease. Additional small remote infarct at the parasagittal left frontal lobe/adjacent to the frontal horn of the left lateral ventricle. MRA showed atherosclerotic change about the carotid bifurcations/proximal ICAs bilaterally without hemodynamically significant greater than 50% stenosis. Wide patency of the vertebral arteries within the neck. Right vertebral artery dominant. Patient was placed on aspirin, Plavix and high intensity statins. PT, OT consult was placed and patient declined He signed out AGAINST MEDICAL ADVICE before he could be seen by neurology and before his 2D echocardiogram was done.

## 2022-03-02 NOTE — Discharge Summary (Addendum)
Physician Discharge Summary   Patient: Jordan Washington MRN: [id number] DOB: Mar 28, 1946  Admit date:     03/01/2022  Discharge date: 03/02/22  Discharge Physician: Joia Doyle   PCP: Pcp, No   Recommendations at discharge:   Patient signed out AGAINST MEDICAL ADVICE Advised to return to ER for evaluation of worsening symptoms  Discharge Diagnoses: Principal Problem:   CVA (cerebral vascular accident) (HCC) Active Problems:   Hypokalemia   Alcohol intoxication (HCC)   Tobacco abuse   OSA (obstructive sleep apnea)  Resolved Problems:   * No resolved hospital problems. *  Hospital Course: Jordan MoBilly M Washington is a 76 y.o. 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  The patient had been having expressive dysphasia.  He denied having any paresthesias or focal muscle weakness.  No tinnitus or vertigo.  No urinary or stool incontinence.  No headache or dizziness or blurred vision.  No nausea or vomiting or abdominal pain.  No chest pain or palpitations.  No cough or wheezing or dyspnea.  No dysuria, oliguria or hematuria or flank pain.  He had a fall a couple weeks ago without injuries.  His niece and nephew who brought him to the emergency room were concerned about a UTI or urosepsis.  He smokes cigarettes and drinks alcohol on a daily basis. He had a CT scan of the head without contrast which showed   interval appearance of moderate to large area of decreased density in the left temporoparietal cortex. There is new small area of low attenuation in the left occipital cortex. 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  Patient had an MRI of the brain which showed evolving  subacute ischemic infarct involving the left temporoccipital cortex, with additional small subacute left occipital cortical infarct. Associated petechial hemorrhage and/or laminar necrosis without frank hemorrhagic transformation or significant regional mass effect. Underlying age-related cerebral atrophy with moderate chronic microvascular ischemic disease. Additional small remote infarct at the parasagittal left frontal lobe/adjacent to the frontal horn of the left lateral ventricle. Patient declined assessment by speech, physical and Occupational Therapy for discharge recommendation Patient signed out AGAINST MEDICAL ADVICE before he could be seen by neurology and before he could get his 2D echocardiogram done. Patient was seen and examined at bedside before he signed out AGAINST MEDICAL ADVICE.  Discussed in detail with his nephew who came to pick him up and advised patient and his nephew against signing out AMA.  Assessment and Plan: * CVA (cerebral vascular accident) [facility](HCC) Patient presented for evaluation of mental status changes and confusion and noted to have a subacute left temporal occipital and subacute left occipital infarcts. MRI of the brain showed evolving subacute ischemic infarct involving the left temporoccipital cortex, with additional small subacute left occipital cortical infarct. Associated petechial hemorrhage and/or laminar necrosis without frank hemorrhagic transformation or significant regional mass effect. Underlying age-related cerebral atrophy with moderate chronic microvascular ischemic disease. Additional small remote infarct at the parasagittal left frontal lobe/adjacent to the frontal horn of the left lateral ventricle. MRA showed atherosclerotic change about the carotid bifurcations/proximal ICAs bilaterally without hemodynamically significant greater than 50% stenosis. Wide patency of the vertebral arteries within the neck. Right vertebral artery dominant. Patient was  placed on aspirin, Plavix and high  intensity statins. PT, OT consult was placed and patient declined He signed out AGAINST MEDICAL ADVICE before he could be seen by neurology and before his 2D echocardiogram was done.  Hypokalemia Supplemented and normalized  OSA (obstructive sleep apnea) CPAP at bedtime  Tobacco abuse Smoking cessation was discussed with patient in detail  Alcohol intoxication (HCC) Placed on CIWA protocol         Consultants: Neurology Procedures performed: MRI, MRA Disposition:  Patient signed out AGAINST MEDICAL ADVICE Diet recommendation:  Cardiac diet DISCHARGE MEDICATION: Allergies as of 03/02/2022       Reactions   Alka-seltzer Original [aspirin Effervescent] Other (See Comments)   "It messes me up and gives me gas"        Medication List     STOP taking these medications    albuterol 108 (90 Base) MCG/ACT inhaler Commonly known as: VENTOLIN HFA   methocarbamol 500 MG tablet Commonly known as: Robaxin        Discharge Exam: Filed Weights   03/01/22 1834  Weight: 127 kg  GENERAL:  76 y.o.-year-old male patient lying in the bed with no acute distress.  EYES: Pupils equal, round, reactive to light and accommodation. No scleral icterus. Extraocular muscles intact.  HEENT: Head atraumatic, normocephalic. Oropharynx and nasopharynx clear.  NECK:  Supple, no jugular venous distention. No thyroid enlargement, no tenderness.  LUNGS: Normal breath sounds bilaterally, no wheezing, rales,rhonchi or crepitation. No use of accessory muscles of respiration.  CARDIOVASCULAR: Regular rate and rhythm, S1, S2 normal. No murmurs, rubs, or gallops.  ABDOMEN: Soft, nondistended, nontender. Bowel sounds present. No organomegaly or mass.  EXTREMITIES: No pedal edema, cyanosis, or clubbing.  NEUROLOGIC: Cranial nerves II through XII are intact set for expressive dysphasia. Muscle strength 5/5 in all extremities. Sensation intact. Gait not checked.   PSYCHIATRIC: The patient is alert and oriented x 3.  Normal affect and good eye contact. SKIN: No obvious rash, lesion, or ulcer.    Condition at discharge: Patient signed out AGAINST MEDICAL ADVICE  The results of significant diagnostics from this hospitalization (including imaging, microbiology, ancillary and laboratory) are listed below for reference.   Imaging Studies: MR BRAIN WO CONTRAST  Result Date: 03/02/2022 CLINICAL DATA:  Initial evaluation for neuro deficit, stroke suspected. EXAM: MRI HEAD WITHOUT CONTRAST MRA HEAD WITHOUT CONTRAST MRA NECK WITHOUT CONTRAST TECHNIQUE: Multiplanar, multiecho pulse sequences of the brain and surrounding structures were obtained without intravenous contrast. Angiographic images of the Circle of Willis were obtained using MRA technique without intravenous contrast. Angiographic images of the neck were obtained using MRA technique without intravenous contrast. Carotid stenosis measurements (when applicable) are obtained utilizing NASCET criteria, using the distal internal carotid diameter as the denominator. COMPARISON:  Prior CT from earlier the same day. FINDINGS: MRI HEAD FINDINGS Brain: Generalized age-related cerebral atrophy. Patchy and confluent T2/FLAIR hyperintensity involving the periventricular and deep white matter both cerebral hemispheres as well as the pons, most consistent with chronic small vessel ischemic disease, moderately advanced in nature. Probable small remote cortical infarct at the parasagittal anterior left frontal lobe (series 21, image 22). Mild chronic hemosiderin staining at this location. Area of evolving encephalomalacia and gliosis involving the left temporal occipital cortex, consistent with an evolving subacute ischemic infarct. Associated T1 hyperintensity with susceptibility artifact consistent with petechial blood products and/or laminar necrosis. Additional small subacute cortical infarct at the left occipital pole  (series 14, image 15). No associated hemorrhage at this location. No other evidence for acute or subacute ischemia.  Gray-white matter differentiation otherwise maintained. No other acute intracranial hemorrhage. Single small chronic microhemorrhage noted at the right midbrain. No mass lesion, midline shift or mass effect. Diffuse ventricular prominence most likely related to global parenchymal volume loss without hydrocephalus. No extra-axial fluid collection. Pituitary gland and suprasellar region within normal limits. Vascular: Major intracranial vascular flow voids are maintained. Normal flow voids preserved within the major dural sinuses. Skull and upper cervical spine: Craniocervical junction within normal limits. Bone marrow signal intensity grossly within normal limits. No scalp soft tissue abnormality. Sinuses/Orbits: Globes orbital soft tissues demonstrate no acute finding. Mild scattered mucosal thickening noted about the ethmoidal air cells and maxillary sinuses. No mastoid effusion. Other: None. MRA HEAD FINDINGS ANTERIOR CIRCULATION: Visualized distal cervical segments of the internal carotid arteries are patent with antegrade flow. Petrous, cavernous, and supraclinoid segments patent without stenosis. 2 mm outpouching extending laterally from the cavernous left ICA consistent with a small aneurysm (series 9, image 111). A1 segments patent bilaterally. Normal anterior communicating artery complex. Anterior cerebral arteries patent without stenosis. No M1 stenosis or occlusion. No proximal MCA branch occlusion. Distal MCA branches perfused and symmetric. POSTERIOR CIRCULATION: Both vertebral arteries patent without stenosis. Right vertebral artery slightly dominant. Right PICA patent. Left PICA not well seen. Basilar patent without stenosis. Superior cerebellar arteries patent bilaterally. Both PCAs primarily supplied via the basilar well perfused to their distal aspects without significant stenosis. MRA  NECK FINDINGS AORTIC ARCH: Examination somewhat technically limited by motion and lack of IV contrast. Partially visualized ascending aorta dilated up to 4.1 cm. Normal branch pattern present at the aortic arch. No visible stenosis about the origin the great vessels. RIGHT CAROTID SYSTEM: Right CCA patent without stenosis. Atheromatous irregularity about the right carotid bulb/proximal right ICA without hemodynamically significant greater than 50% stenosis. Right ICA patent distally without stenosis or evidence for dissection. LEFT CAROTID SYSTEM: Left CCA patent without stenosis. Atheromatous change about the left carotid bulb/proximal left ICA without hemodynamically significant greater than 50% stenosis. Left ICA patent distally without stenosis or evidence for dissection. VERTEBRAL ARTERIES: Origin of the left vertebral artery not well seen. Right vertebral artery dominant. Visualized portion of the vertebral arteries patent with antegrade flow. No stenosis or evidence for dissection. IMPRESSION: MRI HEAD: 1. Evolving subacute ischemic infarct involving the left temporoccipital cortex, with additional small subacute left occipital cortical infarct. Associated petechial hemorrhage and/or laminar necrosis without frank hemorrhagic transformation or significant regional mass effect. 2. Underlying age-related cerebral atrophy with moderate chronic microvascular ischemic disease. 3. Additional small remote infarct at the parasagittal left frontal lobe/adjacent to the frontal horn of the left lateral ventricle. MRA HEAD: 1. Negative intracranial MRA for large vessel occlusion. No hemodynamically significant or correctable stenosis. 2. 2 mm cavernous left ICA aneurysm. MRA NECK: 1. Atherosclerotic change about the carotid bifurcations/proximal ICAs bilaterally without hemodynamically significant greater than 50% stenosis. 2. Wide patency of the vertebral arteries within the neck. Right vertebral artery dominant. 3.  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 Recommend annual imaging followup by CTA or MRA. This recommendation follows 2010 ACCF/AHA/AATS/ACR/ASA/SCA/SCAI/SIR/STS/SVM Guidelines for the Diagnosis and Management of Patients with Thoracic Aortic Disease. Circulation. 2010; 121: Z366-Y403. Aortic aneurysm NOS (ICD10-I71.9) Electronically Signed   By: Rise Mu M.D.   On: 03/02/2022 00:31   MR ANGIO NECK WO CONTRAST  Result Date: 03/02/2022 CLINICAL DATA:  Initial evaluation for neuro deficit, stroke suspected. EXAM: MRI HEAD WITHOUT CONTRAST MRA HEAD WITHOUT CONTRAST MRA NECK WITHOUT CONTRAST TECHNIQUE:  Multiplanar, multiecho pulse sequences of the brain and surrounding structures were obtained without intravenous contrast. Angiographic images of the Circle of Willis were obtained using MRA technique without intravenous contrast. Angiographic images of the neck were obtained using MRA technique without intravenous contrast. Carotid stenosis measurements (when applicable) are obtained utilizing NASCET criteria, using the distal internal carotid diameter as the denominator. COMPARISON:  Prior CT from earlier the same day. FINDINGS: MRI HEAD FINDINGS Brain: Generalized age-related cerebral atrophy. Patchy and confluent T2/FLAIR hyperintensity involving the periventricular and deep white matter both cerebral hemispheres as well as the pons, most consistent with chronic small vessel ischemic disease, moderately advanced in nature. Probable small remote cortical infarct at the parasagittal anterior left frontal lobe (series 21, image 22). Mild chronic hemosiderin staining at this location. Area of evolving encephalomalacia and gliosis involving the left temporal occipital cortex, consistent with an evolving subacute ischemic infarct. Associated T1 hyperintensity with susceptibility artifact consistent with petechial blood products and/or laminar necrosis. Additional small subacute cortical  infarct at the left occipital pole (series 14, image 15). No associated hemorrhage at this location. No other evidence for acute or subacute ischemia. Gray-white matter differentiation otherwise maintained. No other acute intracranial hemorrhage. Single small chronic microhemorrhage noted at the right midbrain. No mass lesion, midline shift or mass effect. Diffuse ventricular prominence most likely related to global parenchymal volume loss without hydrocephalus. No extra-axial fluid collection. Pituitary gland and suprasellar region within normal limits. Vascular: Major intracranial vascular flow voids are maintained. Normal flow voids preserved within the major dural sinuses. Skull and upper cervical spine: Craniocervical junction within normal limits. Bone marrow signal intensity grossly within normal limits. No scalp soft tissue abnormality. Sinuses/Orbits: Globes orbital soft tissues demonstrate no acute finding. Mild scattered mucosal thickening noted about the ethmoidal air cells and maxillary sinuses. No mastoid effusion. Other: None. MRA HEAD FINDINGS ANTERIOR CIRCULATION: Visualized distal cervical segments of the internal carotid arteries are patent with antegrade flow. Petrous, cavernous, and supraclinoid segments patent without stenosis. 2 mm outpouching extending laterally from the cavernous left ICA consistent with a small aneurysm (series 9, image 111). A1 segments patent bilaterally. Normal anterior communicating artery complex. Anterior cerebral arteries patent without stenosis. No M1 stenosis or occlusion. No proximal MCA branch occlusion. Distal MCA branches perfused and symmetric. POSTERIOR CIRCULATION: Both vertebral arteries patent without stenosis. Right vertebral artery slightly dominant. Right PICA patent. Left PICA not well seen. Basilar patent without stenosis. Superior cerebellar arteries patent bilaterally. Both PCAs primarily supplied via the basilar well perfused to their distal  aspects without significant stenosis. MRA NECK FINDINGS AORTIC ARCH: Examination somewhat technically limited by motion and lack of IV contrast. Partially visualized ascending aorta dilated up to 4.1 cm. Normal branch pattern present at the aortic arch. No visible stenosis about the origin the great vessels. RIGHT CAROTID SYSTEM: Right CCA patent without stenosis. Atheromatous irregularity about the right carotid bulb/proximal right ICA without hemodynamically significant greater than 50% stenosis. Right ICA patent distally without stenosis or evidence for dissection. LEFT CAROTID SYSTEM: Left CCA patent without stenosis. Atheromatous change about the left carotid bulb/proximal left ICA without hemodynamically significant greater than 50% stenosis. Left ICA patent distally without stenosis or evidence for dissection. VERTEBRAL ARTERIES: Origin of the left vertebral artery not well seen. Right vertebral artery dominant. Visualized portion of the vertebral arteries patent with antegrade flow. No stenosis or evidence for dissection. IMPRESSION: MRI HEAD: 1. Evolving subacute ischemic infarct involving the left temporoccipital cortex, with additional small subacute left occipital  cortical infarct. Associated petechial hemorrhage and/or laminar necrosis without frank hemorrhagic transformation or significant regional mass effect. 2. Underlying age-related cerebral atrophy with moderate chronic microvascular ischemic disease. 3. Additional small remote infarct at the parasagittal left frontal lobe/adjacent to the frontal horn of the left lateral ventricle. MRA HEAD: 1. Negative intracranial MRA for large vessel occlusion. No hemodynamically significant or correctable stenosis. 2. 2 mm cavernous left ICA aneurysm. MRA NECK: 1. Atherosclerotic change about the carotid bifurcations/proximal ICAs bilaterally without hemodynamically significant greater than 50% stenosis. 2. Wide patency of the vertebral arteries within the  neck. Right vertebral artery dominant. 3. 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 Recommend annual imaging followup by CTA or MRA. This recommendation follows 2010 ACCF/AHA/AATS/ACR/ASA/SCA/SCAI/SIR/STS/SVM Guidelines for the Diagnosis and Management of Patients with Thoracic Aortic Disease. Circulation. 2010; 121: Z610-R604. Aortic aneurysm NOS (ICD10-I71.9) Electronically Signed   By: Rise Mu M.D.   On: 03/02/2022 00:31   MR ANGIO HEAD WO CONTRAST  Result Date: 03/02/2022 CLINICAL DATA:  Initial evaluation for neuro deficit, stroke suspected. EXAM: MRI HEAD WITHOUT CONTRAST MRA HEAD WITHOUT CONTRAST MRA NECK WITHOUT CONTRAST TECHNIQUE: Multiplanar, multiecho pulse sequences of the brain and surrounding structures were obtained without intravenous contrast. Angiographic images of the Circle of Willis were obtained using MRA technique without intravenous contrast. Angiographic images of the neck were obtained using MRA technique without intravenous contrast. Carotid stenosis measurements (when applicable) are obtained utilizing NASCET criteria, using the distal internal carotid diameter as the denominator. COMPARISON:  Prior CT from earlier the same day. FINDINGS: MRI HEAD FINDINGS Brain: Generalized age-related cerebral atrophy. Patchy and confluent T2/FLAIR hyperintensity involving the periventricular and deep white matter both cerebral hemispheres as well as the pons, most consistent with chronic small vessel ischemic disease, moderately advanced in nature. Probable small remote cortical infarct at the parasagittal anterior left frontal lobe (series 21, image 22). Mild chronic hemosiderin staining at this location. Area of evolving encephalomalacia and gliosis involving the left temporal occipital cortex, consistent with an evolving subacute ischemic infarct. Associated T1 hyperintensity with susceptibility artifact consistent with petechial blood products and/or laminar  necrosis. Additional small subacute cortical infarct at the left occipital pole (series 14, image 15). No associated hemorrhage at this location. No other evidence for acute or subacute ischemia. Gray-white matter differentiation otherwise maintained. No other acute intracranial hemorrhage. Single small chronic microhemorrhage noted at the right midbrain. No mass lesion, midline shift or mass effect. Diffuse ventricular prominence most likely related to global parenchymal volume loss without hydrocephalus. No extra-axial fluid collection. Pituitary gland and suprasellar region within normal limits. Vascular: Major intracranial vascular flow voids are maintained. Normal flow voids preserved within the major dural sinuses. Skull and upper cervical spine: Craniocervical junction within normal limits. Bone marrow signal intensity grossly within normal limits. No scalp soft tissue abnormality. Sinuses/Orbits: Globes orbital soft tissues demonstrate no acute finding. Mild scattered mucosal thickening noted about the ethmoidal air cells and maxillary sinuses. No mastoid effusion. Other: None. MRA HEAD FINDINGS ANTERIOR CIRCULATION: Visualized distal cervical segments of the internal carotid arteries are patent with antegrade flow. Petrous, cavernous, and supraclinoid segments patent without stenosis. 2 mm outpouching extending laterally from the cavernous left ICA consistent with a small aneurysm (series 9, image 111). A1 segments patent bilaterally. Normal anterior communicating artery complex. Anterior cerebral arteries patent without stenosis. No M1 stenosis or occlusion. No proximal MCA branch occlusion. Distal MCA branches perfused and symmetric. POSTERIOR CIRCULATION: Both vertebral arteries patent without stenosis. Right vertebral  artery slightly dominant. Right PICA patent. Left PICA not well seen. Basilar patent without stenosis. Superior cerebellar arteries patent bilaterally. Both PCAs primarily supplied via the  basilar well perfused to their distal aspects without significant stenosis. MRA NECK FINDINGS AORTIC ARCH: Examination somewhat technically limited by motion and lack of IV contrast. Partially visualized ascending aorta dilated up to 4.1 cm. Normal branch pattern present at the aortic arch. No visible stenosis about the origin the great vessels. RIGHT CAROTID SYSTEM: Right CCA patent without stenosis. Atheromatous irregularity about the right carotid bulb/proximal right ICA without hemodynamically significant greater than 50% stenosis. Right ICA patent distally without stenosis or evidence for dissection. LEFT CAROTID SYSTEM: Left CCA patent without stenosis. Atheromatous change about the left carotid bulb/proximal left ICA without hemodynamically significant greater than 50% stenosis. Left ICA patent distally without stenosis or evidence for dissection. VERTEBRAL ARTERIES: Origin of the left vertebral artery not well seen. Right vertebral artery dominant. Visualized portion of the vertebral arteries patent with antegrade flow. No stenosis or evidence for dissection. IMPRESSION: MRI HEAD: 1. Evolving subacute ischemic infarct involving the left temporoccipital cortex, with additional small subacute left occipital cortical infarct. Associated petechial hemorrhage and/or laminar necrosis without frank hemorrhagic transformation or significant regional mass effect. 2. Underlying age-related cerebral atrophy with moderate chronic microvascular ischemic disease. 3. Additional small remote infarct at the parasagittal left frontal lobe/adjacent to the frontal horn of the left lateral ventricle. MRA HEAD: 1. Negative intracranial MRA for large vessel occlusion. No hemodynamically significant or correctable stenosis. 2. 2 mm cavernous left ICA aneurysm. MRA NECK: 1. Atherosclerotic change about the carotid bifurcations/proximal ICAs bilaterally without hemodynamically significant greater than 50% stenosis. 2. Wide patency of  the vertebral arteries within the neck. Right vertebral artery dominant. 3. 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 Recommend annual imaging followup by CTA or MRA. This recommendation follows 2010 ACCF/AHA/AATS/ACR/ASA/SCA/SCAI/SIR/STS/SVM Guidelines for the Diagnosis and Management of Patients with Thoracic Aortic Disease. Circulation. 2010; 121: [id number]. Aortic aneurysm NOS (ICD10-I71.9) Electronically Signed   By: Rise Mu M.D.   On: 03/02/2022 00:31   CT HEAD WO CONTRAST ( )  Result Date: 03/01/2022 CLINICAL DATA:  Altered mental status EXAM: CT HEAD WITHOUT CONTRAST TECHNIQUE: Contiguous axial images were obtained from the base of the skull through the vertex without intravenous contrast. RADIATION DOSE REDUCTION: This exam was performed according to the departmental dose-optimization program which includes automated exposure control, adjustment of the mA and/or kV according to patient size and/or use of iterative reconstruction technique. COMPARISON:  11/05/2012 FINDINGS: Brain: No signs of bleeding within the cranium are noted. There is interval appearance of low attenuation in left temporoparietal and left occipital cortex. Cortical sulci are prominent. There is decreased density in periventricular white matter. There is no significant focal mass effect. Vascular: Unremarkable. Skull: Unremarkable. Sinuses/Orbits: Unremarkable. Other: None. IMPRESSION: There is interval appearance of moderate to large area of decreased density in the left temporoparietal cortex. There is new small area of low attenuation in the left occipital cortex. 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 Follow-up MRI as clinically warranted should be considered. There are no signs of bleeding within the cranium. Atrophy. Small-vessel disease. Electronically Signed   By: Ernie Avena M.D.   On: 03/01/2022 21:06    Microbiology: Results for orders placed or performed during the hospital  encounter of 03/01/22  Resp Panel by RT-PCR (Flu A&B, Covid) Anterior Nasal Swab     Status: None   Collection Time: 03/01/22  1:28 AM  Specimen: Anterior Nasal Swab  Result Value Ref Range Status   SARS Coronavirus 2 by RT PCR NEGATIVE NEGATIVE Final    Comment: (NOTE) SARS-CoV-2 target nucleic acids are NOT DETECTED.  The SARS-CoV-2 RNA is generally detectable in upper respiratory specimens during the acute phase of infection. The lowest concentration of SARS-CoV-2 viral copies this assay can detect is 138 copies/mL. A negative result does not preclude SARS-Cov-2 infection and should not be used as the sole basis for treatment or other patient management decisions. A negative result may occur with  improper specimen collection/handling, submission of specimen other than nasopharyngeal swab, presence of viral mutation(s) within the areas targeted by this assay, and inadequate number of viral copies(<138 copies/mL). A negative result must be combined with clinical observations, patient history, and epidemiological information. The expected result is Negative.  Fact Sheet for Patients:  BloggerCourse.com  Fact Sheet for Healthcare Providers:  SeriousBroker.it  This test is no t yet approved or cleared by the [place] FDA and  has been authorized for detection and/or diagnosis of SARS-CoV-2 by FDA under an Emergency Use Authorization (EUA). This EUA will remain  in effect (meaning this test can be used) for the duration of the COVID-19 declaration under Section 564(b)(1) of the Act, 21 U.S.C.section 360bbb-3(b)(1), unless the authorization is terminated  or revoked sooner.       Influenza A by PCR NEGATIVE NEGATIVE Final   Influenza B by PCR NEGATIVE NEGATIVE Final    Comment: (NOTE) The Xpert Xpress SARS-CoV-2/FLU/RSV plus assay is intended as an aid in the diagnosis of influenza from Nasopharyngeal swab specimens  and should not be used as a sole basis for treatment. Nasal washings and aspirates are unacceptable for Xpert Xpress SARS-CoV-2/FLU/RSV testing.  Fact Sheet for Patients: BloggerCourse.com  Fact Sheet for Healthcare Providers: SeriousBroker.it  This test is not yet approved or cleared by the [place] FDA and has been authorized for detection and/or diagnosis of SARS-CoV-2 by FDA under an Emergency Use Authorization (EUA). This EUA will remain in effect (meaning this test can be used) for the duration of the COVID-19 declaration under Section 564(b)(1) of the Act, 21 U.S.C. section 360bbb-3(b)(1), unless the authorization is terminated or revoked.  Performed at [facility], [address] [place], [place] [address]     Labs: CBC: Recent Labs  Lab 03/01/22 1846 03/01/22 2204 03/02/22 0259  WBC 8.4 7.2 6.1  NEUTROABS  --  4.5  --   HGB 15.0 14.3 13.4  HCT 45.3 42.8 39.9  MCV 95.6 96.0 94.8  PLT 147* 124* 111*   Basic Metabolic Panel: Recent Labs  Lab 03/01/22 1846 03/02/22 0259  NA 138 138  K 3.1* 4.0  CL 108 110  CO2 21* 24  GLUCOSE 91 92  BUN 13 11  CREATININE 0.65 0.74  CALCIUM 8.5* 8.2*   Liver Function Tests: Recent Labs  Lab 03/01/22 1846  AST 83*  ALT 63*  ALKPHOS 64  BILITOT 1.1  PROT 9.3*  ALBUMIN 3.5   CBG: No results for input(s): "GLUCAP" in the last 168 hours.  Discharge time spent: greater than 30 minutes.  Signed: Lucile Shutters, MD [facility] 03/02/2022

## 2022-03-02 NOTE — Progress Notes (Signed)
PHARMACIST - PHYSICIAN COMMUNICATION  CONCERNING:  Enoxaparin (Lovenox) for DVT Prophylaxis    RECOMMENDATION: Patient was prescribed enoxaprin 40mg  q24 hours for VTE prophylaxis.   Filed Weights   03/01/22 1834  Weight: 127 kg (280 lb)    Body mass index is 36.94 kg/m.  Estimated Creatinine Clearance: 111.4 mL/min (by C-G formula based on SCr of 0.65 mg/dL).   Based on [facility] policy patient is candidate for enoxaparin 0.5mg /kg TBW SQ every 24 hours based on BMI being >30.  DESCRIPTION: Pharmacy has adjusted enoxaparin dose per [facility] policy.  Patient is now receiving enoxaparin 0.5 mg/kg every 24 hours   [facility], PharmD, [facility] 03/02/2022 2:48 AM

## 2022-03-02 NOTE — ED Notes (Signed)
Patient continues to need redirection. Patient has removed IV and states he has called his sister for a "ride out of here." Patient ambulatory; refusing therapy eval at this time.   03/02/22 1000  Mobility  Activity Ambulated independently in hallway;Ambulated independently to bathroom  Level of Assistance Independent  Assistive Device [employer] Ambulated (ft) 50 ft  Activity Response Tolerated well  Transport method Ambulatory;Wheelchair

## 2022-03-02 NOTE — Assessment & Plan Note (Addendum)
Supplemented and normalized

## 2022-03-02 NOTE — Assessment & Plan Note (Addendum)
-   CPAP at bedtime 

## 2022-03-02 NOTE — ED Notes (Signed)
Dr. Mansy @ the bedside. 

## 2022-03-02 NOTE — ED Notes (Signed)
Pt assisted with the urinal

## 2022-03-02 NOTE — Assessment & Plan Note (Addendum)
Smoking cessation was discussed with patient in detail

## 2022-03-02 NOTE — Progress Notes (Addendum)
SLP Cancellation Note  Patient Details Name: Jordan Washington MRN: [id number] DOB: 05-May-1946   Cancelled treatment:       Reason Eval/Treat Not Completed: Other (comment)   SLP consult received and appreciated. Chart review completed. Consulted with RN. Per RN, pt leaving AMA. Will complete SLP consult given pt's refusal of services. RN aware and in agreement.  Clyde Canterbury, M.S., CCC-SLP Speech-Language Pathologist [facility] [phone] (ASCOM)  Woodroe Chen 03/02/2022, 11:14 AM

## 2022-03-02 NOTE — Progress Notes (Signed)
PT Cancellation Note  Patient Details Name: Jordan Washington MRN: [id number] DOB: May 18, 1946   Cancelled Treatment:    Reason Eval/Treat Not Completed: PT screened, no needs identified, will sign off. Per notes, pt has been ambulatory with use of SPC in hallway without assist and is planning to leave AMA. At baseline per OT. Screened by other rehab cohorts and no PT services indicated at this time. Please re-consult if necessary.   Lalana Wachter 03/02/2022, 11:39 AM Elizabeth Palau, PT, DPT, GCS [phone]

## 2022-03-02 NOTE — H&P (Addendum)
[facility]   PATIENT NAME: Jordan Washington    MR#:  [id number]  DATE OF BIRTH:  05/23/1946  DATE OF ADMISSION:  03/01/2022  PRIMARY CARE PHYSICIAN: Pcp, No   Patient is coming from: Home  REQUESTING/REFERRING PHYSICIAN: Minna Antis, MD  CHIEF COMPLAINT:   Chief Complaint  Patient presents with   Altered Mental Status    HISTORY OF PRESENT ILLNESS:  Jordan Washington is a 76 y.o. 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  The patient has been having expressive dysphasia.  He denies any paresthesias or focal muscle weakness.  No tinnitus or vertigo.  No urinary or stool incontinence.  No headache or dizziness or blurred vision.  No nausea or vomiting or abdominal pain.  No chest pain or palpitations.  No cough or wheezing or dyspnea.  No dysuria, oliguria or hematuria or flank pain.  He had a fall a couple weeks ago without injuries.  His niece and nephew who brought him to the emergency room were concerned about a UTI or urosepsis.  He smokes cigarettes and drinks alcohol on a daily basis.  ED Course: When he came to the ER, vital signs were within normal BP was 134/90 then 149/95.  Labs revealed hypokalemia 3.1 and slightly elevated AST 83 and ALT of 6329.8 with unremarkable CBC.  UA was positive for therapy.  Alcohol level was 23 and urine drug screen came back negative.  EKG as reviewed by me : EKG showed atrial flutter with variable AV block and a rate of 70, Q waves anteroseptally and T wave inversion inferiorly with prolonged QT interval (QTc of 483 MS) Imaging: Noncontrast head CT scan revealed interval appearance of a moderate to large area of  increased density in the left temporal parietal cortex with new small area of low-attenuation in the left occipital cortex, findings suggesting age-indeterminate infarcts.  No signs of bleeding was found.  There was atrophy and small vessel disease.   MRI without contrast revealed: 1. Evolving subacute ischemic infarct involving the left temporoccipital cortex, with additional small subacute left occipital cortical infarct. Associated petechial hemorrhage and/or laminar necrosis without frank hemorrhagic transformation or significant regional mass effect. 2. Underlying age-related cerebral atrophy with moderate chronic microvascular ischemic disease. 3. Additional small remote infarct at the parasagittal left frontal lobe/adjacent to the frontal horn of the left lateral ventricle.   MRA HEAD:   1. Negative intracranial MRA for large vessel occlusion. No hemodynamically significant or correctable stenosis. 2. 2 mm cavernous left ICA aneurysm.   MRA NECK:   1. Atherosclerotic change about the carotid bifurcations/proximal ICAs bilaterally without hemodynamically significant greater than 50% stenosis. 2. Wide patency of the vertebral arteries within the neck. Right vertebral artery dominant. 3. 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 Recommend annual imaging followup by CTA or MRA.  The patient was given 40 mill equivalent p.o. potassium chloride.  He will be admitted to a medical telemetry observation bed for further evaluation and management. 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  SOCIAL  HISTORY:   Social History   Tobacco Use   Smoking status: Every Day    Types: Cigarettes   Smokeless tobacco: Never  Substance Use Topics   Alcohol use: Yes     Alcohol/week: 2.0 standard drinks of alcohol    Types: 2 Shots of liquor per week    FAMILY HISTORY:  Positive for diabetes mellitus, hypertension and CVA.  DRUG ALLERGIES:   Allergies  Allergen Reactions   Alka-Seltzer Original [Aspirin Effervescent] Other (See Comments)    "It messes me up and gives me gas"    REVIEW OF SYSTEMS:   ROS As per history of present illness. All pertinent systems were reviewed above. Constitutional, HEENT, cardiovascular, respiratory, GI, GU, musculoskeletal, neuro, psychiatric, endocrine, integumentary and hematologic systems were reviewed and are otherwise negative/unremarkable except for positive findings mentioned above in the HPI.   MEDICATIONS AT HOME:   Prior to Admission medications   Medication Sig Start Date End Date Taking? Authorizing Provider  albuterol (VENTOLIN HFA) 108 (90 Base) MCG/ACT inhaler Inhale 2 puffs into the lungs every 6 (six) hours as needed for wheezing or shortness of breath. Patient not taking: Reported on 03/02/2022 01/12/21   Delton Prairie, MD  methocarbamol (ROBAXIN) 500 MG tablet Take 1 tablet (500 mg total) by mouth every 6 (six) hours as needed for muscle spasms (pain). Patient not taking: Reported on 03/02/2022 01/12/21   Delton Prairie, MD      VITAL SIGNS:  Blood pressure 137/85, pulse 70, temperature 98 F (36.7 C), temperature source Oral, resp. rate 16, height 6\' 1"  (1.854 m), weight 127 kg, SpO2 99 %.  PHYSICAL EXAMINATION:  Physical Exam  GENERAL:  76 y.o.-year-old male patient lying in the bed with no acute distress.  EYES: Pupils equal, round, reactive to light and accommodation. No scleral icterus. Extraocular muscles intact.  HEENT: Head atraumatic, normocephalic. Oropharynx and nasopharynx clear.  NECK:  Supple, no jugular venous distention. No thyroid enlargement, no tenderness.  LUNGS: Normal breath sounds bilaterally, no wheezing, rales,rhonchi or crepitation. No use of accessory muscles of  respiration.  CARDIOVASCULAR: Regular rate and rhythm, S1, S2 normal. No murmurs, rubs, or gallops.  ABDOMEN: Soft, nondistended, nontender. Bowel sounds present. No organomegaly or mass.  EXTREMITIES: No pedal edema, cyanosis, or clubbing.  NEUROLOGIC: Cranial nerves II through XII are intact set for expressive dysphasia. Muscle strength 5/5 in all extremities. Sensation intact. Gait not checked.  PSYCHIATRIC: The patient is alert and oriented x 3.  Normal affect and good eye contact. SKIN: No obvious rash, lesion, or ulcer.   LABORATORY PANEL:   CBC Recent Labs  Lab 03/02/22 0259  WBC 6.1  HGB 13.4  HCT 39.9  PLT 111*   ------------------------------------------------------------------------------------------------------------------  Chemistries  Recent Labs  Lab 03/01/22 1846 03/02/22 0259  NA 138 138  K 3.1* 4.0  CL 108 110  CO2 21* 24  GLUCOSE 91 92  BUN 13 11  CREATININE 0.65 0.74  CALCIUM 8.5* 8.2*  AST 83*  --   ALT 63*  --   ALKPHOS 64  --   BILITOT 1.1  --    ------------------------------------------------------------------------------------------------------------------  Cardiac Enzymes No results for input(s): "TROPONINI" in the last 168 hours. ------------------------------------------------------------------------------------------------------------------  RADIOLOGY:  MR BRAIN WO CONTRAST  Result Date: 03/02/2022 CLINICAL DATA:  Initial evaluation for neuro deficit, stroke suspected. EXAM: MRI HEAD WITHOUT CONTRAST MRA HEAD WITHOUT CONTRAST MRA NECK WITHOUT CONTRAST TECHNIQUE: Multiplanar, multiecho pulse sequences of the brain and surrounding structures were obtained without intravenous contrast.  Angiographic images of the Circle of Willis were obtained using MRA technique without intravenous contrast. Angiographic images of the neck were obtained using MRA technique without intravenous contrast. Carotid stenosis measurements (when applicable) are  obtained utilizing NASCET criteria, using the distal internal carotid diameter as the denominator. COMPARISON:  Prior CT from earlier the same day. FINDINGS: MRI HEAD FINDINGS Brain: Generalized age-related cerebral atrophy. Patchy and confluent T2/FLAIR hyperintensity involving the periventricular and deep white matter both cerebral hemispheres as well as the pons, most consistent with chronic small vessel ischemic disease, moderately advanced in nature. Probable small remote cortical infarct at the parasagittal anterior left frontal lobe (series 21, image 22). Mild chronic hemosiderin staining at this location. Area of evolving encephalomalacia and gliosis involving the left temporal occipital cortex, consistent with an evolving subacute ischemic infarct. Associated T1 hyperintensity with susceptibility artifact consistent with petechial blood products and/or laminar necrosis. Additional small subacute cortical infarct at the left occipital pole (series 14, image 15). No associated hemorrhage at this location. No other evidence for acute or subacute ischemia. Gray-white matter differentiation otherwise maintained. No other acute intracranial hemorrhage. Single small chronic microhemorrhage noted at the right midbrain. No mass lesion, midline shift or mass effect. Diffuse ventricular prominence most likely related to global parenchymal volume loss without hydrocephalus. No extra-axial fluid collection. Pituitary gland and suprasellar region within normal limits. Vascular: Major intracranial vascular flow voids are maintained. Normal flow voids preserved within the major dural sinuses. Skull and upper cervical spine: Craniocervical junction within normal limits. Bone marrow signal intensity grossly within normal limits. No scalp soft tissue abnormality. Sinuses/Orbits: Globes orbital soft tissues demonstrate no acute finding. Mild scattered mucosal thickening noted about the ethmoidal air cells and maxillary  sinuses. No mastoid effusion. Other: None. MRA HEAD FINDINGS ANTERIOR CIRCULATION: Visualized distal cervical segments of the internal carotid arteries are patent with antegrade flow. Petrous, cavernous, and supraclinoid segments patent without stenosis. 2 mm outpouching extending laterally from the cavernous left ICA consistent with a small aneurysm (series 9, image 111). A1 segments patent bilaterally. Normal anterior communicating artery complex. Anterior cerebral arteries patent without stenosis. No M1 stenosis or occlusion. No proximal MCA branch occlusion. Distal MCA branches perfused and symmetric. POSTERIOR CIRCULATION: Both vertebral arteries patent without stenosis. Right vertebral artery slightly dominant. Right PICA patent. Left PICA not well seen. Basilar patent without stenosis. Superior cerebellar arteries patent bilaterally. Both PCAs primarily supplied via the basilar well perfused to their distal aspects without significant stenosis. MRA NECK FINDINGS AORTIC ARCH: Examination somewhat technically limited by motion and lack of IV contrast. Partially visualized ascending aorta dilated up to 4.1 cm. Normal branch pattern present at the aortic arch. No visible stenosis about the origin the great vessels. RIGHT CAROTID SYSTEM: Right CCA patent without stenosis. Atheromatous irregularity about the right carotid bulb/proximal right ICA without hemodynamically significant greater than 50% stenosis. Right ICA patent distally without stenosis or evidence for dissection. LEFT CAROTID SYSTEM: Left CCA patent without stenosis. Atheromatous change about the left carotid bulb/proximal left ICA without hemodynamically significant greater than 50% stenosis. Left ICA patent distally without stenosis or evidence for dissection. VERTEBRAL ARTERIES: Origin of the left vertebral artery not well seen. Right vertebral artery dominant. Visualized portion of the vertebral arteries patent with antegrade flow. No stenosis or  evidence for dissection. IMPRESSION: MRI HEAD: 1. Evolving subacute ischemic infarct involving the left temporoccipital cortex, with additional small subacute left occipital cortical infarct. Associated petechial hemorrhage and/or laminar necrosis without frank hemorrhagic transformation or significant  regional mass effect. 2. Underlying age-related cerebral atrophy with moderate chronic microvascular ischemic disease. 3. Additional small remote infarct at the parasagittal left frontal lobe/adjacent to the frontal horn of the left lateral ventricle. MRA HEAD: 1. Negative intracranial MRA for large vessel occlusion. No hemodynamically significant or correctable stenosis. 2. 2 mm cavernous left ICA aneurysm. MRA NECK: 1. Atherosclerotic change about the carotid bifurcations/proximal ICAs bilaterally without hemodynamically significant greater than 50% stenosis. 2. Wide patency of the vertebral arteries within the neck. Right vertebral artery dominant. 3. 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 Recommend annual imaging followup by CTA or MRA. This recommendation follows 2010 ACCF/AHA/AATS/ACR/ASA/SCA/SCAI/SIR/STS/SVM Guidelines for the Diagnosis and Management of Patients with Thoracic Aortic Disease. Circulation. 2010; 121: R154-M086. Aortic aneurysm NOS (ICD10-I71.9) Electronically Signed   By: Rise Mu M.D.   On: 03/02/2022 00:31   MR ANGIO NECK WO CONTRAST  Result Date: 03/02/2022 CLINICAL DATA:  Initial evaluation for neuro deficit, stroke suspected. EXAM: MRI HEAD WITHOUT CONTRAST MRA HEAD WITHOUT CONTRAST MRA NECK WITHOUT CONTRAST TECHNIQUE: Multiplanar, multiecho pulse sequences of the brain and surrounding structures were obtained without intravenous contrast. Angiographic images of the Circle of Willis were obtained using MRA technique without intravenous contrast. Angiographic images of the neck were obtained using MRA technique without intravenous contrast. Carotid  stenosis measurements (when applicable) are obtained utilizing NASCET criteria, using the distal internal carotid diameter as the denominator. COMPARISON:  Prior CT from earlier the same day. FINDINGS: MRI HEAD FINDINGS Brain: Generalized age-related cerebral atrophy. Patchy and confluent T2/FLAIR hyperintensity involving the periventricular and deep white matter both cerebral hemispheres as well as the pons, most consistent with chronic small vessel ischemic disease, moderately advanced in nature. Probable small remote cortical infarct at the parasagittal anterior left frontal lobe (series 21, image 22). Mild chronic hemosiderin staining at this location. Area of evolving encephalomalacia and gliosis involving the left temporal occipital cortex, consistent with an evolving subacute ischemic infarct. Associated T1 hyperintensity with susceptibility artifact consistent with petechial blood products and/or laminar necrosis. Additional small subacute cortical infarct at the left occipital pole (series 14, image 15). No associated hemorrhage at this location. No other evidence for acute or subacute ischemia. Gray-white matter differentiation otherwise maintained. No other acute intracranial hemorrhage. Single small chronic microhemorrhage noted at the right midbrain. No mass lesion, midline shift or mass effect. Diffuse ventricular prominence most likely related to global parenchymal volume loss without hydrocephalus. No extra-axial fluid collection. Pituitary gland and suprasellar region within normal limits. Vascular: Major intracranial vascular flow voids are maintained. Normal flow voids preserved within the major dural sinuses. Skull and upper cervical spine: Craniocervical junction within normal limits. Bone marrow signal intensity grossly within normal limits. No scalp soft tissue abnormality. Sinuses/Orbits: Globes orbital soft tissues demonstrate no acute finding. Mild scattered mucosal thickening noted about  the ethmoidal air cells and maxillary sinuses. No mastoid effusion. Other: None. MRA HEAD FINDINGS ANTERIOR CIRCULATION: Visualized distal cervical segments of the internal carotid arteries are patent with antegrade flow. Petrous, cavernous, and supraclinoid segments patent without stenosis. 2 mm outpouching extending laterally from the cavernous left ICA consistent with a small aneurysm (series 9, image 111). A1 segments patent bilaterally. Normal anterior communicating artery complex. Anterior cerebral arteries patent without stenosis. No M1 stenosis or occlusion. No proximal MCA branch occlusion. Distal MCA branches perfused and symmetric. POSTERIOR CIRCULATION: Both vertebral arteries patent without stenosis. Right vertebral artery slightly dominant. Right PICA patent. Left PICA not well seen. Basilar patent without  stenosis. Superior cerebellar arteries patent bilaterally. Both PCAs primarily supplied via the basilar well perfused to their distal aspects without significant stenosis. MRA NECK FINDINGS AORTIC ARCH: Examination somewhat technically limited by motion and lack of IV contrast. Partially visualized ascending aorta dilated up to 4.1 cm. Normal branch pattern present at the aortic arch. No visible stenosis about the origin the great vessels. RIGHT CAROTID SYSTEM: Right CCA patent without stenosis. Atheromatous irregularity about the right carotid bulb/proximal right ICA without hemodynamically significant greater than 50% stenosis. Right ICA patent distally without stenosis or evidence for dissection. LEFT CAROTID SYSTEM: Left CCA patent without stenosis. Atheromatous change about the left carotid bulb/proximal left ICA without hemodynamically significant greater than 50% stenosis. Left ICA patent distally without stenosis or evidence for dissection. VERTEBRAL ARTERIES: Origin of the left vertebral artery not well seen. Right vertebral artery dominant. Visualized portion of the vertebral arteries  patent with antegrade flow. No stenosis or evidence for dissection. IMPRESSION: MRI HEAD: 1. Evolving subacute ischemic infarct involving the left temporoccipital cortex, with additional small subacute left occipital cortical infarct. Associated petechial hemorrhage and/or laminar necrosis without frank hemorrhagic transformation or significant regional mass effect. 2. Underlying age-related cerebral atrophy with moderate chronic microvascular ischemic disease. 3. Additional small remote infarct at the parasagittal left frontal lobe/adjacent to the frontal horn of the left lateral ventricle. MRA HEAD: 1. Negative intracranial MRA for large vessel occlusion. No hemodynamically significant or correctable stenosis. 2. 2 mm cavernous left ICA aneurysm. MRA NECK: 1. Atherosclerotic change about the carotid bifurcations/proximal ICAs bilaterally without hemodynamically significant greater than 50% stenosis. 2. Wide patency of the vertebral arteries within the neck. Right vertebral artery dominant. 3. 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 Recommend annual imaging followup by CTA or MRA. This recommendation follows 2010 ACCF/AHA/AATS/ACR/ASA/SCA/SCAI/SIR/STS/SVM Guidelines for the Diagnosis and Management of Patients with Thoracic Aortic Disease. Circulation. 2010; 121: Z610-R604. Aortic aneurysm NOS (ICD10-I71.9) Electronically Signed   By: Rise Mu M.D.   On: 03/02/2022 00:31   MR ANGIO HEAD WO CONTRAST  Result Date: 03/02/2022 CLINICAL DATA:  Initial evaluation for neuro deficit, stroke suspected. EXAM: MRI HEAD WITHOUT CONTRAST MRA HEAD WITHOUT CONTRAST MRA NECK WITHOUT CONTRAST TECHNIQUE: Multiplanar, multiecho pulse sequences of the brain and surrounding structures were obtained without intravenous contrast. Angiographic images of the Circle of Willis were obtained using MRA technique without intravenous contrast. Angiographic images of the neck were obtained using MRA technique  without intravenous contrast. Carotid stenosis measurements (when applicable) are obtained utilizing NASCET criteria, using the distal internal carotid diameter as the denominator. COMPARISON:  Prior CT from earlier the same day. FINDINGS: MRI HEAD FINDINGS Brain: Generalized age-related cerebral atrophy. Patchy and confluent T2/FLAIR hyperintensity involving the periventricular and deep white matter both cerebral hemispheres as well as the pons, most consistent with chronic small vessel ischemic disease, moderately advanced in nature. Probable small remote cortical infarct at the parasagittal anterior left frontal lobe (series 21, image 22). Mild chronic hemosiderin staining at this location. Area of evolving encephalomalacia and gliosis involving the left temporal occipital cortex, consistent with an evolving subacute ischemic infarct. Associated T1 hyperintensity with susceptibility artifact consistent with petechial blood products and/or laminar necrosis. Additional small subacute cortical infarct at the left occipital pole (series 14, image 15). No associated hemorrhage at this location. No other evidence for acute or subacute ischemia. Gray-white matter differentiation otherwise maintained. No other acute intracranial hemorrhage. Single small chronic microhemorrhage noted at the right midbrain. No mass lesion, midline shift  or mass effect. Diffuse ventricular prominence most likely related to global parenchymal volume loss without hydrocephalus. No extra-axial fluid collection. Pituitary gland and suprasellar region within normal limits. Vascular: Major intracranial vascular flow voids are maintained. Normal flow voids preserved within the major dural sinuses. Skull and upper cervical spine: Craniocervical junction within normal limits. Bone marrow signal intensity grossly within normal limits. No scalp soft tissue abnormality. Sinuses/Orbits: Globes orbital soft tissues demonstrate no acute finding. Mild  scattered mucosal thickening noted about the ethmoidal air cells and maxillary sinuses. No mastoid effusion. Other: None. MRA HEAD FINDINGS ANTERIOR CIRCULATION: Visualized distal cervical segments of the internal carotid arteries are patent with antegrade flow. Petrous, cavernous, and supraclinoid segments patent without stenosis. 2 mm outpouching extending laterally from the cavernous left ICA consistent with a small aneurysm (series 9, image 111). A1 segments patent bilaterally. Normal anterior communicating artery complex. Anterior cerebral arteries patent without stenosis. No M1 stenosis or occlusion. No proximal MCA branch occlusion. Distal MCA branches perfused and symmetric. POSTERIOR CIRCULATION: Both vertebral arteries patent without stenosis. Right vertebral artery slightly dominant. Right PICA patent. Left PICA not well seen. Basilar patent without stenosis. Superior cerebellar arteries patent bilaterally. Both PCAs primarily supplied via the basilar well perfused to their distal aspects without significant stenosis. MRA NECK FINDINGS AORTIC ARCH: Examination somewhat technically limited by motion and lack of IV contrast. Partially visualized ascending aorta dilated up to 4.1 cm. Normal branch pattern present at the aortic arch. No visible stenosis about the origin the great vessels. RIGHT CAROTID SYSTEM: Right CCA patent without stenosis. Atheromatous irregularity about the right carotid bulb/proximal right ICA without hemodynamically significant greater than 50% stenosis. Right ICA patent distally without stenosis or evidence for dissection. LEFT CAROTID SYSTEM: Left CCA patent without stenosis. Atheromatous change about the left carotid bulb/proximal left ICA without hemodynamically significant greater than 50% stenosis. Left ICA patent distally without stenosis or evidence for dissection. VERTEBRAL ARTERIES: Origin of the left vertebral artery not well seen. Right vertebral artery dominant.  Visualized portion of the vertebral arteries patent with antegrade flow. No stenosis or evidence for dissection. IMPRESSION: MRI HEAD: 1. Evolving subacute ischemic infarct involving the left temporoccipital cortex, with additional small subacute left occipital cortical infarct. Associated petechial hemorrhage and/or laminar necrosis without frank hemorrhagic transformation or significant regional mass effect. 2. Underlying age-related cerebral atrophy with moderate chronic microvascular ischemic disease. 3. Additional small remote infarct at the parasagittal left frontal lobe/adjacent to the frontal horn of the left lateral ventricle. MRA HEAD: 1. Negative intracranial MRA for large vessel occlusion. No hemodynamically significant or correctable stenosis. 2. 2 mm cavernous left ICA aneurysm. MRA NECK: 1. Atherosclerotic change about the carotid bifurcations/proximal ICAs bilaterally without hemodynamically significant greater than 50% stenosis. 2. Wide patency of the vertebral arteries within the neck. Right vertebral artery dominant. 3. 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 Recommend annual imaging followup by CTA or MRA. This recommendation follows 2010 ACCF/AHA/AATS/ACR/ASA/SCA/SCAI/SIR/STS/SVM Guidelines for the Diagnosis and Management of Patients with Thoracic Aortic Disease. Circulation. 2010; 121: Z610-R604. Aortic aneurysm NOS (ICD10-I71.9) Electronically Signed   By: Rise Mu M.D.   On: 03/02/2022 00:31   CT HEAD WO CONTRAST ( )  Result Date: 03/01/2022 CLINICAL DATA:  Altered mental status EXAM: CT HEAD WITHOUT CONTRAST TECHNIQUE: Contiguous axial images were obtained from the base of the skull through the vertex without intravenous contrast. RADIATION DOSE REDUCTION: This exam was performed according to the departmental dose-optimization program which includes automated exposure control, adjustment  of the mA and/or kV according to patient size and/or use of  iterative reconstruction technique. COMPARISON:  11/05/2012 FINDINGS: Brain: No signs of bleeding within the cranium are noted. There is interval appearance of low attenuation in left temporoparietal and left occipital cortex. Cortical sulci are prominent. There is decreased density in periventricular white matter. There is no significant focal mass effect. Vascular: Unremarkable. Skull: Unremarkable. Sinuses/Orbits: Unremarkable. Other: None. IMPRESSION: There is interval appearance of moderate to large area of decreased density in the left temporoparietal cortex. There is new small area of low attenuation in the left occipital cortex. 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 Follow-up MRI as clinically warranted should be considered. There are no signs of bleeding within the cranium. Atrophy. Small-vessel disease. Electronically Signed   By: Ernie Avena M.D.   On: 03/01/2022 21:06      IMPRESSION AND PLAN:  Assessment and Plan: * CVA (cerebral vascular accident) (HCC) - The patient subacute left temporal occipital and subacute left occipital infarcts. - He will be admitted to a medical telemetry observation bed. - We will follow neurochecks every 4 hours for 24 hours. - We will place him on aspirin and Plavix.  He has no allergy to aspirin. - Statin therapy will be provided. - Fasting lipids will be checked. - Obtain PT/OT and ST consults. - Neurology consult will be obtained. - I notified Dr. Selina Cooley about the patient. - We will obtain a 2D echo with bubble study and bilateral carotid Doppler for further assessment.  Hypokalemia - Potassium will be replaced and magnesium level will be checked.  OSA (obstructive sleep apnea) - We will utilize a CPAP while he is here nightly.  Tobacco abuse - I counseled him for smoking cessation and he will receive further counseling here.  Alcohol intoxication (HCC) - We will monitor him for alcohol withdrawal. - As needed Ativan will be  utilized. - I counseled him for cessation.       DVT prophylaxis: Lovenox.  Advanced Care Planning:  Code Status: full code.  Family Communication:  The plan of care was discussed in details with the patient (and family). I answered all questions. The patient agreed to proceed with the above mentioned plan. Further management will depend upon hospital course. Disposition Plan: Back to previous home environment Consults called: Neurology. All the records are reviewed and case discussed with ED provider.  Status is: Observation   I certify that at the time of admission, it is my clinical judgment that the patient will require  hospital care extending less than 2 midnights.                            Dispo: The patient is from: Home              Anticipated d/c is to: Home              Patient currently is not medically stable to d/c.              Difficult to place patient: No  Hannah Beat M.D on 03/02/2022 at 6:37 AM  [facility]   From 7 PM-7 AM, contact night-coverage [email or web address]  CC: Primary care physician; Pcp, No

## 2022-03-02 NOTE — Plan of Care (Signed)
Neurology Brief Note  Neurology was consulted on this gentleman who presented with subacute ischemic strokes. Unfortunately before I was able to see him, he left AMA.   If he does return and his willing to further assessed, he should have the following rest of his workup done:  - Re-examination to help sort out of he truly has AMS vs expressive aphasia 2/2 stroke - AMS labs: - B12, TSH, ammonia, RPR - Consider EEG if truly appears altered - ASA 81mg  daily + plavix 75mg  daily x21 days f/b ASA 81mg  daily monotherapy after that - Start atorvastatin 80mg  daily - Start thiamine 100mg  daily - >48 hrs out from sx - goal normotension, avoid hypotension - TTE w/ bubble - Cancelled carotid (pt already had MRA H&N) - q4 hr neuro checks - STAT head CT for any change in neuro exam - Tele - PT/OT/SLP - Stroke education - Amb referral to neurology upon discharge   As patient has signed himself out AMA, neurology will sign off. I will arrange outpatient neuro f/u, although I do not know if he will be amenable to that.  , MD [facility] [phone]  If 7pm- 7am, please page neurology on call as listed in AMION.

## 2022-03-02 NOTE — Progress Notes (Signed)
Amb ref neuro placed 

## 2022-03-02 NOTE — ED Notes (Signed)
Patient called nephew to pick him up. Writer spoke with Dr. Joylene Igo who stated if patient wanted to leave it would be against medical advice. Nephew and patient agreeable and requesting to leave AMA. AMA papers signed and patient discharged with nephew. Dr. Joylene Igo notified. See previous note; patient refused OT eval and was ambulatory upon discharge.

## 2022-03-02 NOTE — ED Notes (Signed)
Jeannett Senior (daughter) [phone] call with any concerns.

## 2022-03-03 DIAGNOSIS — F10929 Alcohol use, unspecified with intoxication, unspecified: Secondary | ICD-10-CM | POA: Diagnosis not present

## 2022-03-03 DIAGNOSIS — G4733 Obstructive sleep apnea (adult) (pediatric): Secondary | ICD-10-CM | POA: Diagnosis not present

## 2022-03-03 DIAGNOSIS — I63512 Cerebral infarction due to unspecified occlusion or stenosis of left middle cerebral artery: Secondary | ICD-10-CM | POA: Diagnosis not present

## 2022-03-03 DIAGNOSIS — F17210 Nicotine dependence, cigarettes, uncomplicated: Secondary | ICD-10-CM | POA: Diagnosis not present

## 2022-03-03 DIAGNOSIS — J189 Pneumonia, unspecified organism: Secondary | ICD-10-CM | POA: Diagnosis not present

## 2022-03-03 DIAGNOSIS — R7401 Elevation of levels of liver transaminase levels: Secondary | ICD-10-CM | POA: Diagnosis not present

## 2022-03-03 DIAGNOSIS — B1920 Unspecified viral hepatitis C without hepatic coma: Secondary | ICD-10-CM | POA: Diagnosis not present

## 2022-03-03 DIAGNOSIS — R932 Abnormal findings on diagnostic imaging of liver and biliary tract: Secondary | ICD-10-CM | POA: Diagnosis not present

## 2022-03-03 DIAGNOSIS — I351 Nonrheumatic aortic (valve) insufficiency: Secondary | ICD-10-CM | POA: Diagnosis not present

## 2022-03-03 DIAGNOSIS — R4701 Aphasia: Secondary | ICD-10-CM | POA: Diagnosis not present

## 2022-03-03 DIAGNOSIS — D696 Thrombocytopenia, unspecified: Secondary | ICD-10-CM | POA: Diagnosis not present

## 2022-03-03 DIAGNOSIS — I6359 Cerebral infarction due to unspecified occlusion or stenosis of other cerebral artery: Secondary | ICD-10-CM | POA: Diagnosis not present

## 2022-03-03 DIAGNOSIS — I639 Cerebral infarction, unspecified: Secondary | ICD-10-CM | POA: Diagnosis not present

## 2022-03-03 LAB — RPR: RPR Ser Ql: NONREACTIVE

## 2022-03-04 DIAGNOSIS — B1920 Unspecified viral hepatitis C without hepatic coma: Secondary | ICD-10-CM | POA: Diagnosis not present

## 2022-03-04 DIAGNOSIS — F10129 Alcohol abuse with intoxication, unspecified: Secondary | ICD-10-CM | POA: Diagnosis not present

## 2022-03-04 DIAGNOSIS — G9340 Encephalopathy, unspecified: Secondary | ICD-10-CM | POA: Diagnosis not present

## 2022-03-04 DIAGNOSIS — I639 Cerebral infarction, unspecified: Secondary | ICD-10-CM | POA: Diagnosis not present

## 2022-03-04 DIAGNOSIS — R4701 Aphasia: Secondary | ICD-10-CM | POA: Diagnosis not present

## 2022-03-04 DIAGNOSIS — I63512 Cerebral infarction due to unspecified occlusion or stenosis of left middle cerebral artery: Secondary | ICD-10-CM | POA: Diagnosis not present

## 2022-03-05 DIAGNOSIS — I517 Cardiomegaly: Secondary | ICD-10-CM | POA: Diagnosis not present

## 2022-03-05 DIAGNOSIS — D696 Thrombocytopenia, unspecified: Secondary | ICD-10-CM | POA: Diagnosis not present

## 2022-03-05 DIAGNOSIS — I4892 Unspecified atrial flutter: Secondary | ICD-10-CM | POA: Diagnosis not present

## 2022-03-05 DIAGNOSIS — R4701 Aphasia: Secondary | ICD-10-CM | POA: Diagnosis not present

## 2022-03-05 DIAGNOSIS — I38 Endocarditis, valve unspecified: Secondary | ICD-10-CM | POA: Diagnosis not present

## 2022-03-05 DIAGNOSIS — I63512 Cerebral infarction due to unspecified occlusion or stenosis of left middle cerebral artery: Secondary | ICD-10-CM | POA: Diagnosis not present

## 2022-03-06 DIAGNOSIS — I4891 Unspecified atrial fibrillation: Secondary | ICD-10-CM | POA: Diagnosis not present

## 2022-03-06 DIAGNOSIS — I63512 Cerebral infarction due to unspecified occlusion or stenosis of left middle cerebral artery: Secondary | ICD-10-CM | POA: Diagnosis not present

## 2022-03-06 DIAGNOSIS — I4892 Unspecified atrial flutter: Secondary | ICD-10-CM | POA: Diagnosis not present

## 2022-03-07 DIAGNOSIS — I63512 Cerebral infarction due to unspecified occlusion or stenosis of left middle cerebral artery: Secondary | ICD-10-CM | POA: Diagnosis not present

## 2022-03-07 DIAGNOSIS — I4891 Unspecified atrial fibrillation: Secondary | ICD-10-CM | POA: Diagnosis not present

## 2022-03-07 DIAGNOSIS — I4892 Unspecified atrial flutter: Secondary | ICD-10-CM | POA: Diagnosis not present

## 2022-03-08 DIAGNOSIS — I63512 Cerebral infarction due to unspecified occlusion or stenosis of left middle cerebral artery: Secondary | ICD-10-CM | POA: Diagnosis not present

## 2022-03-08 DIAGNOSIS — I4891 Unspecified atrial fibrillation: Secondary | ICD-10-CM | POA: Diagnosis not present

## 2022-03-08 DIAGNOSIS — M1612 Unilateral primary osteoarthritis, left hip: Secondary | ICD-10-CM | POA: Diagnosis not present

## 2022-03-08 DIAGNOSIS — I4892 Unspecified atrial flutter: Secondary | ICD-10-CM | POA: Diagnosis not present

## 2022-03-09 DIAGNOSIS — I639 Cerebral infarction, unspecified: Secondary | ICD-10-CM | POA: Diagnosis not present

## 2022-03-09 DIAGNOSIS — R4701 Aphasia: Secondary | ICD-10-CM | POA: Diagnosis not present

## 2022-06-03 ENCOUNTER — Ambulatory Visit: Payer: Self-pay | Admitting: Physician Assistant

## 2022-08-20 IMAGING — CR DG CHEST 2V
1 series · 2 of 2 positions shown · non-contrast
Comparison: None.

CLINICAL DATA: Left-sided rib pain with coughing

EXAM:
CHEST - 2 VIEW

[Series 1: dg chest 2 view · 0.14mm/px · 2 of 2 slices shown]
[im 1/2]
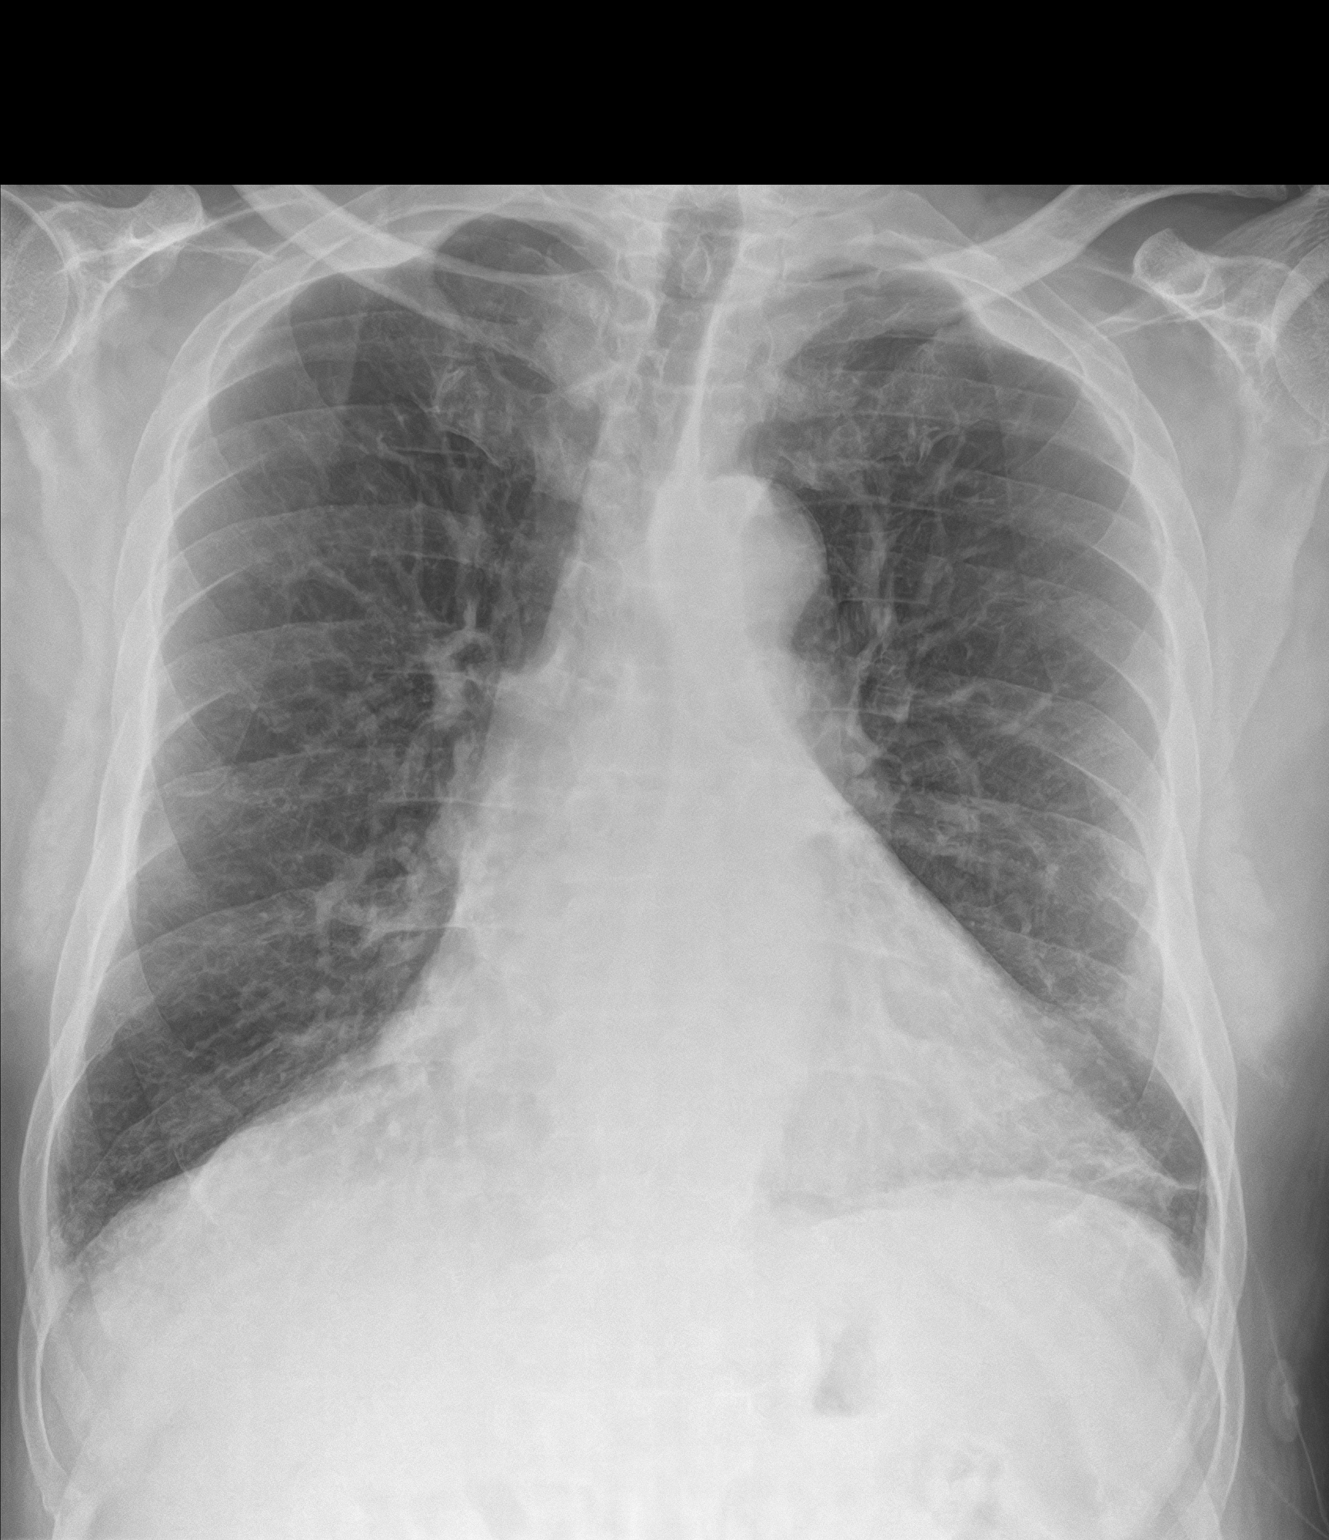
[im 2/2]
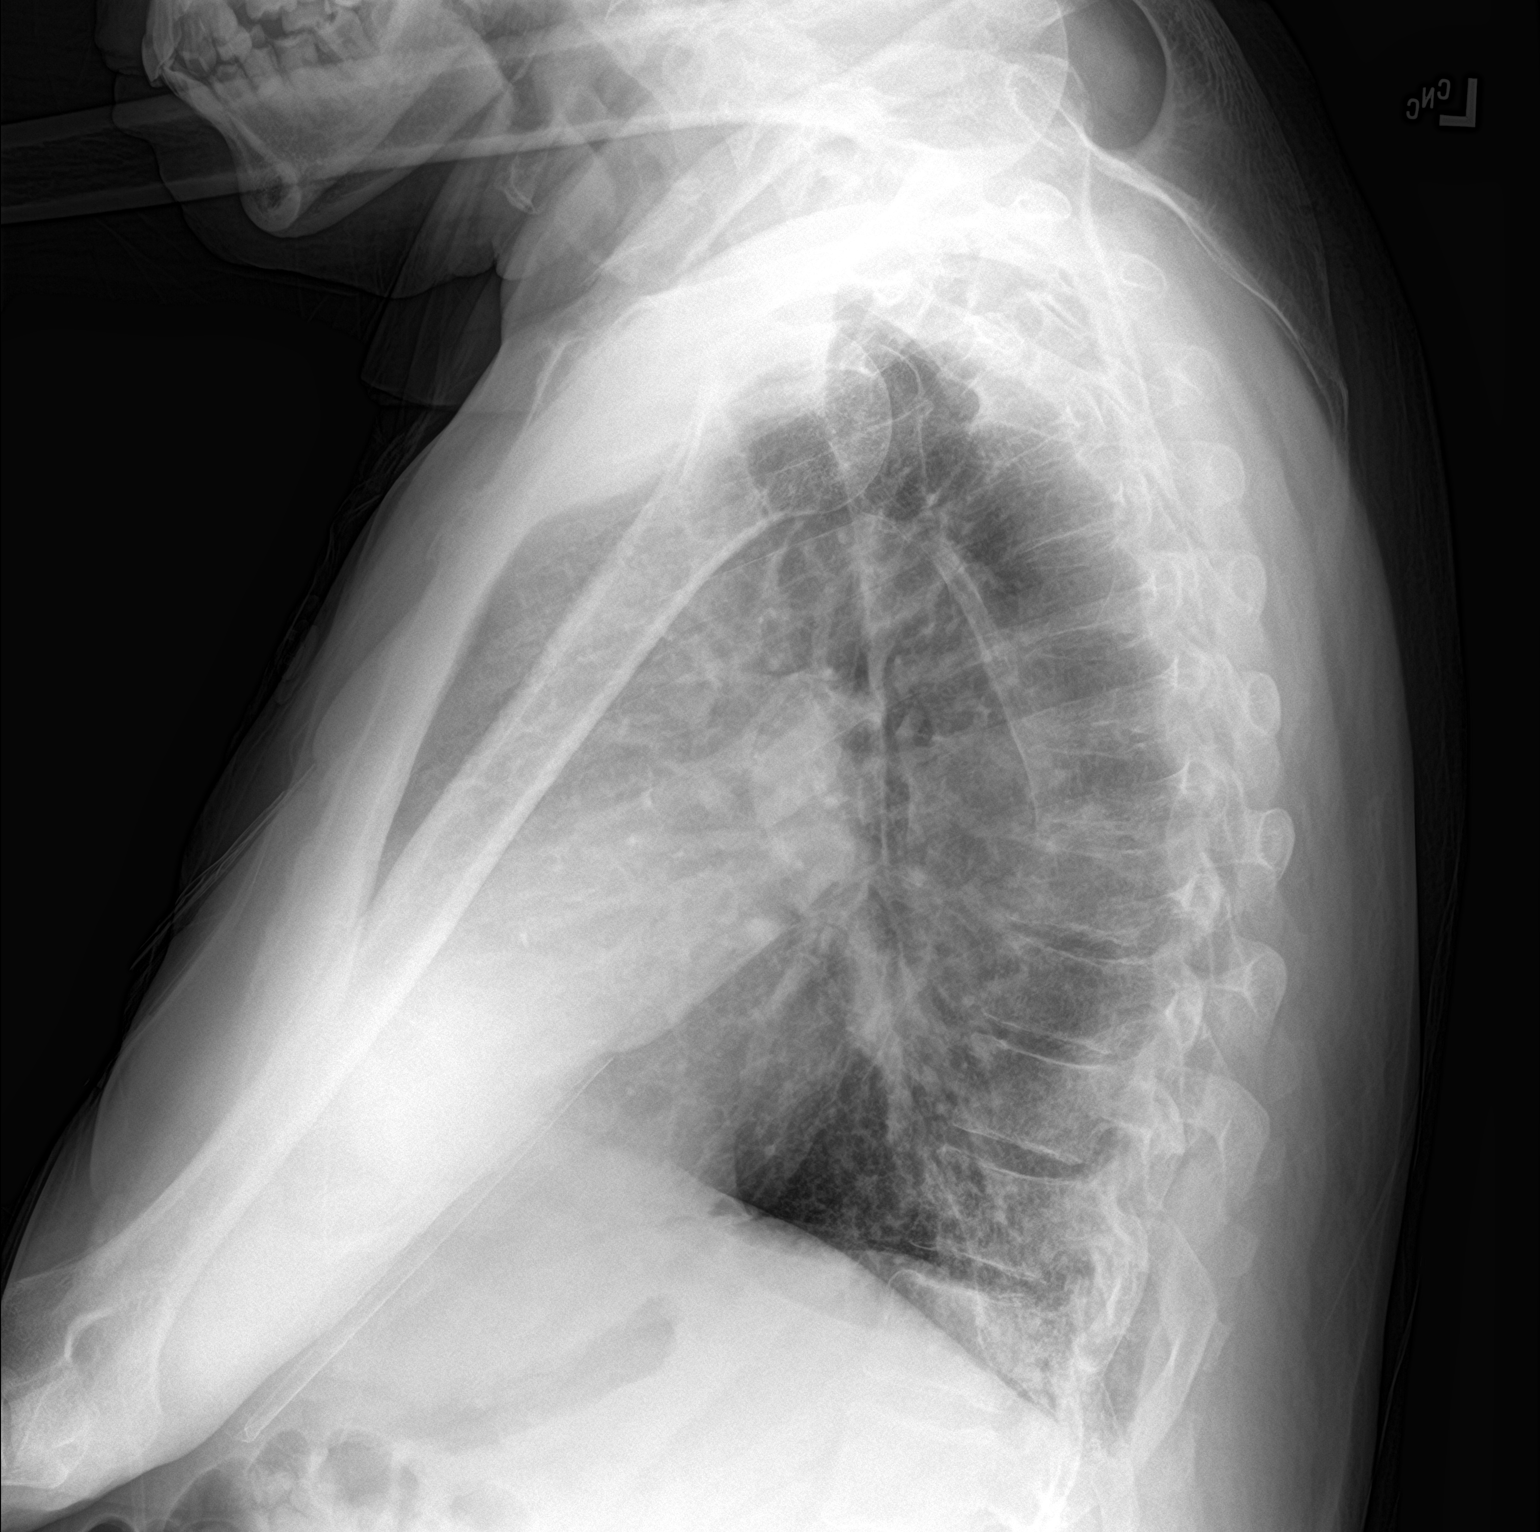

[2 of 2 positions shown; findings below may reference images not displayed]

FINDINGS: Mild opacity overlapping the spine on the lateral view. No edema,
effusion, or pneumothorax. Borderline heart size. Aortic tortuosity.
IMPRESSION: 1. Mild left lower lobe infiltrate, suspicious for pneumonia.
Recommend follow-up radiograph after any treatment.
2. No visible rib fracture.

## 2022-09-28 DIAGNOSIS — G319 Degenerative disease of nervous system, unspecified: Secondary | ICD-10-CM | POA: Diagnosis not present

## 2022-09-28 DIAGNOSIS — R918 Other nonspecific abnormal finding of lung field: Secondary | ICD-10-CM | POA: Diagnosis not present

## 2022-09-28 DIAGNOSIS — Z9989 Dependence on other enabling machines and devices: Secondary | ICD-10-CM | POA: Diagnosis not present

## 2022-09-28 DIAGNOSIS — R0902 Hypoxemia: Secondary | ICD-10-CM | POA: Diagnosis not present

## 2022-09-28 DIAGNOSIS — R29810 Facial weakness: Secondary | ICD-10-CM | POA: Diagnosis not present

## 2022-09-28 DIAGNOSIS — B182 Chronic viral hepatitis C: Secondary | ICD-10-CM | POA: Diagnosis not present

## 2022-09-28 DIAGNOSIS — G4733 Obstructive sleep apnea (adult) (pediatric): Secondary | ICD-10-CM | POA: Diagnosis not present

## 2022-09-28 DIAGNOSIS — R4781 Slurred speech: Secondary | ICD-10-CM | POA: Diagnosis not present

## 2022-09-28 DIAGNOSIS — I080 Rheumatic disorders of both mitral and aortic valves: Secondary | ICD-10-CM | POA: Diagnosis not present

## 2022-09-28 DIAGNOSIS — F17210 Nicotine dependence, cigarettes, uncomplicated: Secondary | ICD-10-CM | POA: Diagnosis not present

## 2022-09-28 DIAGNOSIS — I10 Essential (primary) hypertension: Secondary | ICD-10-CM | POA: Diagnosis not present

## 2022-09-28 DIAGNOSIS — I6601 Occlusion and stenosis of right middle cerebral artery: Secondary | ICD-10-CM | POA: Diagnosis not present

## 2022-09-28 DIAGNOSIS — R130 Aphagia: Secondary | ICD-10-CM | POA: Diagnosis not present

## 2022-09-28 DIAGNOSIS — R52 Pain, unspecified: Secondary | ICD-10-CM | POA: Diagnosis not present

## 2022-09-28 DIAGNOSIS — F05 Delirium due to known physiological condition: Secondary | ICD-10-CM | POA: Diagnosis not present

## 2022-09-28 DIAGNOSIS — G9389 Other specified disorders of brain: Secondary | ICD-10-CM | POA: Diagnosis not present

## 2022-09-28 DIAGNOSIS — Z8673 Personal history of transient ischemic attack (TIA), and cerebral infarction without residual deficits: Secondary | ICD-10-CM | POA: Diagnosis not present

## 2022-09-28 DIAGNOSIS — G8191 Hemiplegia, unspecified affecting right dominant side: Secondary | ICD-10-CM | POA: Diagnosis not present

## 2022-09-28 DIAGNOSIS — I69318 Other symptoms and signs involving cognitive functions following cerebral infarction: Secondary | ICD-10-CM | POA: Diagnosis not present

## 2022-09-28 DIAGNOSIS — Z7901 Long term (current) use of anticoagulants: Secondary | ICD-10-CM | POA: Diagnosis not present

## 2022-09-28 DIAGNOSIS — R569 Unspecified convulsions: Secondary | ICD-10-CM | POA: Diagnosis not present

## 2022-09-28 DIAGNOSIS — Z452 Encounter for adjustment and management of vascular access device: Secondary | ICD-10-CM | POA: Diagnosis not present

## 2022-09-28 DIAGNOSIS — J690 Pneumonitis due to inhalation of food and vomit: Secondary | ICD-10-CM | POA: Diagnosis not present

## 2022-09-28 DIAGNOSIS — B1920 Unspecified viral hepatitis C without hepatic coma: Secondary | ICD-10-CM | POA: Diagnosis not present

## 2022-09-28 DIAGNOSIS — R4701 Aphasia: Secondary | ICD-10-CM | POA: Diagnosis not present

## 2022-09-28 DIAGNOSIS — R410 Disorientation, unspecified: Secondary | ICD-10-CM | POA: Diagnosis not present

## 2022-09-28 DIAGNOSIS — R140 Abdominal distension (gaseous): Secondary | ICD-10-CM | POA: Diagnosis not present

## 2022-09-28 DIAGNOSIS — R638 Other symptoms and signs concerning food and fluid intake: Secondary | ICD-10-CM | POA: Diagnosis not present

## 2022-09-28 DIAGNOSIS — R633 Feeding difficulties, unspecified: Secondary | ICD-10-CM | POA: Diagnosis not present

## 2022-09-28 DIAGNOSIS — I77810 Thoracic aortic ectasia: Secondary | ICD-10-CM | POA: Diagnosis not present

## 2022-09-28 DIAGNOSIS — Z7289 Other problems related to lifestyle: Secondary | ICD-10-CM | POA: Diagnosis not present

## 2022-09-28 DIAGNOSIS — I4891 Unspecified atrial fibrillation: Secondary | ICD-10-CM | POA: Diagnosis not present

## 2022-09-28 DIAGNOSIS — R9401 Abnormal electroencephalogram [EEG]: Secondary | ICD-10-CM | POA: Diagnosis not present

## 2022-09-28 DIAGNOSIS — I63411 Cerebral infarction due to embolism of right middle cerebral artery: Secondary | ICD-10-CM | POA: Diagnosis not present

## 2022-09-28 DIAGNOSIS — Z931 Gastrostomy status: Secondary | ICD-10-CM | POA: Diagnosis not present

## 2022-09-28 DIAGNOSIS — M6281 Muscle weakness (generalized): Secondary | ICD-10-CM | POA: Diagnosis not present

## 2022-09-28 DIAGNOSIS — R509 Fever, unspecified: Secondary | ICD-10-CM | POA: Diagnosis not present

## 2022-09-28 DIAGNOSIS — R001 Bradycardia, unspecified: Secondary | ICD-10-CM | POA: Diagnosis not present

## 2022-09-28 DIAGNOSIS — I69391 Dysphagia following cerebral infarction: Secondary | ICD-10-CM | POA: Diagnosis not present

## 2022-09-28 DIAGNOSIS — F1090 Alcohol use, unspecified, uncomplicated: Secondary | ICD-10-CM | POA: Diagnosis not present

## 2022-09-28 DIAGNOSIS — I69320 Aphasia following cerebral infarction: Secondary | ICD-10-CM | POA: Diagnosis not present

## 2022-09-28 DIAGNOSIS — R339 Retention of urine, unspecified: Secondary | ICD-10-CM | POA: Diagnosis not present

## 2022-09-28 DIAGNOSIS — I63511 Cerebral infarction due to unspecified occlusion or stenosis of right middle cerebral artery: Secondary | ICD-10-CM | POA: Diagnosis not present

## 2022-09-28 DIAGNOSIS — I4892 Unspecified atrial flutter: Secondary | ICD-10-CM | POA: Diagnosis not present

## 2022-09-28 DIAGNOSIS — R2689 Other abnormalities of gait and mobility: Secondary | ICD-10-CM | POA: Diagnosis not present

## 2022-09-28 DIAGNOSIS — I63311 Cerebral infarction due to thrombosis of right middle cerebral artery: Secondary | ICD-10-CM | POA: Diagnosis not present

## 2022-09-28 DIAGNOSIS — F32A Depression, unspecified: Secondary | ICD-10-CM | POA: Diagnosis not present

## 2022-09-28 DIAGNOSIS — R451 Restlessness and agitation: Secondary | ICD-10-CM | POA: Diagnosis not present

## 2022-09-28 DIAGNOSIS — Z4682 Encounter for fitting and adjustment of non-vascular catheter: Secondary | ICD-10-CM | POA: Diagnosis not present

## 2022-09-28 DIAGNOSIS — I69322 Dysarthria following cerebral infarction: Secondary | ICD-10-CM | POA: Diagnosis not present

## 2022-09-28 DIAGNOSIS — I639 Cerebral infarction, unspecified: Secondary | ICD-10-CM | POA: Diagnosis not present

## 2022-09-28 DIAGNOSIS — Z79899 Other long term (current) drug therapy: Secondary | ICD-10-CM | POA: Diagnosis not present

## 2022-09-29 DIAGNOSIS — Z4682 Encounter for fitting and adjustment of non-vascular catheter: Secondary | ICD-10-CM | POA: Diagnosis not present

## 2022-09-29 DIAGNOSIS — Z452 Encounter for adjustment and management of vascular access device: Secondary | ICD-10-CM | POA: Diagnosis not present

## 2022-09-29 DIAGNOSIS — R4701 Aphasia: Secondary | ICD-10-CM | POA: Diagnosis not present

## 2022-09-29 DIAGNOSIS — I69320 Aphasia following cerebral infarction: Secondary | ICD-10-CM | POA: Diagnosis not present

## 2022-09-29 DIAGNOSIS — G4733 Obstructive sleep apnea (adult) (pediatric): Secondary | ICD-10-CM | POA: Diagnosis not present

## 2022-09-29 DIAGNOSIS — Z7289 Other problems related to lifestyle: Secondary | ICD-10-CM | POA: Diagnosis not present

## 2022-09-29 DIAGNOSIS — I4891 Unspecified atrial fibrillation: Secondary | ICD-10-CM | POA: Diagnosis not present

## 2022-09-29 DIAGNOSIS — Z9989 Dependence on other enabling machines and devices: Secondary | ICD-10-CM | POA: Diagnosis not present

## 2022-09-29 DIAGNOSIS — F17210 Nicotine dependence, cigarettes, uncomplicated: Secondary | ICD-10-CM | POA: Diagnosis not present

## 2022-09-29 DIAGNOSIS — B1920 Unspecified viral hepatitis C without hepatic coma: Secondary | ICD-10-CM | POA: Diagnosis not present

## 2022-09-29 DIAGNOSIS — I63511 Cerebral infarction due to unspecified occlusion or stenosis of right middle cerebral artery: Secondary | ICD-10-CM | POA: Diagnosis not present

## 2022-09-30 DIAGNOSIS — B1920 Unspecified viral hepatitis C without hepatic coma: Secondary | ICD-10-CM | POA: Diagnosis not present

## 2022-09-30 DIAGNOSIS — I4891 Unspecified atrial fibrillation: Secondary | ICD-10-CM | POA: Diagnosis not present

## 2022-09-30 DIAGNOSIS — I080 Rheumatic disorders of both mitral and aortic valves: Secondary | ICD-10-CM | POA: Diagnosis not present

## 2022-09-30 DIAGNOSIS — R569 Unspecified convulsions: Secondary | ICD-10-CM | POA: Diagnosis not present

## 2022-09-30 DIAGNOSIS — Z8673 Personal history of transient ischemic attack (TIA), and cerebral infarction without residual deficits: Secondary | ICD-10-CM | POA: Diagnosis not present

## 2022-09-30 DIAGNOSIS — I63511 Cerebral infarction due to unspecified occlusion or stenosis of right middle cerebral artery: Secondary | ICD-10-CM | POA: Diagnosis not present

## 2022-09-30 DIAGNOSIS — R9401 Abnormal electroencephalogram [EEG]: Secondary | ICD-10-CM | POA: Diagnosis not present

## 2022-10-01 DIAGNOSIS — Z8673 Personal history of transient ischemic attack (TIA), and cerebral infarction without residual deficits: Secondary | ICD-10-CM | POA: Diagnosis not present

## 2022-10-01 DIAGNOSIS — I4891 Unspecified atrial fibrillation: Secondary | ICD-10-CM | POA: Diagnosis not present

## 2022-10-01 DIAGNOSIS — B1920 Unspecified viral hepatitis C without hepatic coma: Secondary | ICD-10-CM | POA: Diagnosis not present

## 2022-10-01 DIAGNOSIS — I63511 Cerebral infarction due to unspecified occlusion or stenosis of right middle cerebral artery: Secondary | ICD-10-CM | POA: Diagnosis not present

## 2022-10-01 DIAGNOSIS — R451 Restlessness and agitation: Secondary | ICD-10-CM | POA: Diagnosis not present

## 2022-10-02 DIAGNOSIS — Z7901 Long term (current) use of anticoagulants: Secondary | ICD-10-CM | POA: Diagnosis not present

## 2022-10-02 DIAGNOSIS — I4891 Unspecified atrial fibrillation: Secondary | ICD-10-CM | POA: Diagnosis not present

## 2022-10-02 DIAGNOSIS — Z452 Encounter for adjustment and management of vascular access device: Secondary | ICD-10-CM | POA: Diagnosis not present

## 2022-10-02 DIAGNOSIS — Z8673 Personal history of transient ischemic attack (TIA), and cerebral infarction without residual deficits: Secondary | ICD-10-CM | POA: Diagnosis not present

## 2022-10-02 DIAGNOSIS — G9389 Other specified disorders of brain: Secondary | ICD-10-CM | POA: Diagnosis not present

## 2022-10-02 DIAGNOSIS — R451 Restlessness and agitation: Secondary | ICD-10-CM | POA: Diagnosis not present

## 2022-10-02 DIAGNOSIS — G319 Degenerative disease of nervous system, unspecified: Secondary | ICD-10-CM | POA: Diagnosis not present

## 2022-10-02 DIAGNOSIS — B1920 Unspecified viral hepatitis C without hepatic coma: Secondary | ICD-10-CM | POA: Diagnosis not present

## 2022-10-02 DIAGNOSIS — R918 Other nonspecific abnormal finding of lung field: Secondary | ICD-10-CM | POA: Diagnosis not present

## 2022-10-02 DIAGNOSIS — Z4682 Encounter for fitting and adjustment of non-vascular catheter: Secondary | ICD-10-CM | POA: Diagnosis not present

## 2022-10-02 DIAGNOSIS — I639 Cerebral infarction, unspecified: Secondary | ICD-10-CM | POA: Diagnosis not present

## 2022-10-02 DIAGNOSIS — I63511 Cerebral infarction due to unspecified occlusion or stenosis of right middle cerebral artery: Secondary | ICD-10-CM | POA: Diagnosis not present

## 2022-10-03 DIAGNOSIS — R4701 Aphasia: Secondary | ICD-10-CM | POA: Diagnosis not present

## 2022-10-03 DIAGNOSIS — I10 Essential (primary) hypertension: Secondary | ICD-10-CM | POA: Diagnosis not present

## 2022-10-03 DIAGNOSIS — I6601 Occlusion and stenosis of right middle cerebral artery: Secondary | ICD-10-CM | POA: Diagnosis not present

## 2022-10-03 DIAGNOSIS — R451 Restlessness and agitation: Secondary | ICD-10-CM | POA: Diagnosis not present

## 2022-10-03 DIAGNOSIS — I4891 Unspecified atrial fibrillation: Secondary | ICD-10-CM | POA: Diagnosis not present

## 2022-10-03 DIAGNOSIS — J690 Pneumonitis due to inhalation of food and vomit: Secondary | ICD-10-CM | POA: Diagnosis not present

## 2022-10-03 DIAGNOSIS — R509 Fever, unspecified: Secondary | ICD-10-CM | POA: Diagnosis not present

## 2022-10-03 DIAGNOSIS — B1920 Unspecified viral hepatitis C without hepatic coma: Secondary | ICD-10-CM | POA: Diagnosis not present

## 2022-10-03 DIAGNOSIS — I63511 Cerebral infarction due to unspecified occlusion or stenosis of right middle cerebral artery: Secondary | ICD-10-CM | POA: Diagnosis not present

## 2022-10-04 DIAGNOSIS — I63511 Cerebral infarction due to unspecified occlusion or stenosis of right middle cerebral artery: Secondary | ICD-10-CM | POA: Diagnosis not present

## 2022-10-04 DIAGNOSIS — I4891 Unspecified atrial fibrillation: Secondary | ICD-10-CM | POA: Diagnosis not present

## 2022-10-04 DIAGNOSIS — R451 Restlessness and agitation: Secondary | ICD-10-CM | POA: Diagnosis not present

## 2022-10-04 DIAGNOSIS — R339 Retention of urine, unspecified: Secondary | ICD-10-CM | POA: Diagnosis not present

## 2022-10-04 DIAGNOSIS — B1920 Unspecified viral hepatitis C without hepatic coma: Secondary | ICD-10-CM | POA: Diagnosis not present

## 2022-10-05 DIAGNOSIS — I4891 Unspecified atrial fibrillation: Secondary | ICD-10-CM | POA: Diagnosis not present

## 2022-10-05 DIAGNOSIS — R4701 Aphasia: Secondary | ICD-10-CM | POA: Diagnosis not present

## 2022-10-05 DIAGNOSIS — R451 Restlessness and agitation: Secondary | ICD-10-CM | POA: Diagnosis not present

## 2022-10-05 DIAGNOSIS — R52 Pain, unspecified: Secondary | ICD-10-CM | POA: Diagnosis not present

## 2022-10-05 DIAGNOSIS — I63511 Cerebral infarction due to unspecified occlusion or stenosis of right middle cerebral artery: Secondary | ICD-10-CM | POA: Diagnosis not present

## 2022-10-05 DIAGNOSIS — R339 Retention of urine, unspecified: Secondary | ICD-10-CM | POA: Diagnosis not present

## 2022-10-05 DIAGNOSIS — B1920 Unspecified viral hepatitis C without hepatic coma: Secondary | ICD-10-CM | POA: Diagnosis not present

## 2022-10-05 DIAGNOSIS — I10 Essential (primary) hypertension: Secondary | ICD-10-CM | POA: Diagnosis not present

## 2022-10-05 DIAGNOSIS — R509 Fever, unspecified: Secondary | ICD-10-CM | POA: Diagnosis not present

## 2022-10-06 DIAGNOSIS — I63511 Cerebral infarction due to unspecified occlusion or stenosis of right middle cerebral artery: Secondary | ICD-10-CM | POA: Diagnosis not present

## 2022-10-06 DIAGNOSIS — R339 Retention of urine, unspecified: Secondary | ICD-10-CM | POA: Diagnosis not present

## 2022-10-06 DIAGNOSIS — I4891 Unspecified atrial fibrillation: Secondary | ICD-10-CM | POA: Diagnosis not present

## 2022-10-06 DIAGNOSIS — I10 Essential (primary) hypertension: Secondary | ICD-10-CM | POA: Diagnosis not present

## 2022-10-06 DIAGNOSIS — B1920 Unspecified viral hepatitis C without hepatic coma: Secondary | ICD-10-CM | POA: Diagnosis not present

## 2022-10-06 DIAGNOSIS — R0902 Hypoxemia: Secondary | ICD-10-CM | POA: Diagnosis not present

## 2022-10-06 DIAGNOSIS — R451 Restlessness and agitation: Secondary | ICD-10-CM | POA: Diagnosis not present

## 2022-10-06 DIAGNOSIS — R918 Other nonspecific abnormal finding of lung field: Secondary | ICD-10-CM | POA: Diagnosis not present

## 2022-10-06 DIAGNOSIS — R509 Fever, unspecified: Secondary | ICD-10-CM | POA: Diagnosis not present

## 2022-10-07 DIAGNOSIS — R509 Fever, unspecified: Secondary | ICD-10-CM | POA: Diagnosis not present

## 2022-10-07 DIAGNOSIS — B1920 Unspecified viral hepatitis C without hepatic coma: Secondary | ICD-10-CM | POA: Diagnosis not present

## 2022-10-07 DIAGNOSIS — I63511 Cerebral infarction due to unspecified occlusion or stenosis of right middle cerebral artery: Secondary | ICD-10-CM | POA: Diagnosis not present

## 2022-10-07 DIAGNOSIS — I4891 Unspecified atrial fibrillation: Secondary | ICD-10-CM | POA: Diagnosis not present

## 2022-10-07 DIAGNOSIS — R451 Restlessness and agitation: Secondary | ICD-10-CM | POA: Diagnosis not present

## 2022-10-07 DIAGNOSIS — R339 Retention of urine, unspecified: Secondary | ICD-10-CM | POA: Diagnosis not present

## 2022-10-07 DIAGNOSIS — R4701 Aphasia: Secondary | ICD-10-CM | POA: Diagnosis not present

## 2022-10-07 DIAGNOSIS — I10 Essential (primary) hypertension: Secondary | ICD-10-CM | POA: Diagnosis not present

## 2022-10-08 DIAGNOSIS — J690 Pneumonitis due to inhalation of food and vomit: Secondary | ICD-10-CM | POA: Diagnosis not present

## 2022-10-08 DIAGNOSIS — R451 Restlessness and agitation: Secondary | ICD-10-CM | POA: Diagnosis not present

## 2022-10-08 DIAGNOSIS — I4891 Unspecified atrial fibrillation: Secondary | ICD-10-CM | POA: Diagnosis not present

## 2022-10-08 DIAGNOSIS — Z4682 Encounter for fitting and adjustment of non-vascular catheter: Secondary | ICD-10-CM | POA: Diagnosis not present

## 2022-10-08 DIAGNOSIS — R509 Fever, unspecified: Secondary | ICD-10-CM | POA: Diagnosis not present

## 2022-10-08 DIAGNOSIS — I63511 Cerebral infarction due to unspecified occlusion or stenosis of right middle cerebral artery: Secondary | ICD-10-CM | POA: Diagnosis not present

## 2022-10-08 DIAGNOSIS — Z452 Encounter for adjustment and management of vascular access device: Secondary | ICD-10-CM | POA: Diagnosis not present

## 2022-10-09 DIAGNOSIS — I4891 Unspecified atrial fibrillation: Secondary | ICD-10-CM | POA: Diagnosis not present

## 2022-10-09 DIAGNOSIS — I4892 Unspecified atrial flutter: Secondary | ICD-10-CM | POA: Diagnosis not present

## 2022-10-09 DIAGNOSIS — F1090 Alcohol use, unspecified, uncomplicated: Secondary | ICD-10-CM | POA: Diagnosis not present

## 2022-10-09 DIAGNOSIS — R451 Restlessness and agitation: Secondary | ICD-10-CM | POA: Diagnosis not present

## 2022-10-09 DIAGNOSIS — R001 Bradycardia, unspecified: Secondary | ICD-10-CM | POA: Diagnosis not present

## 2022-10-09 DIAGNOSIS — I63511 Cerebral infarction due to unspecified occlusion or stenosis of right middle cerebral artery: Secondary | ICD-10-CM | POA: Diagnosis not present

## 2022-10-10 DIAGNOSIS — I63511 Cerebral infarction due to unspecified occlusion or stenosis of right middle cerebral artery: Secondary | ICD-10-CM | POA: Diagnosis not present

## 2022-10-10 DIAGNOSIS — I4892 Unspecified atrial flutter: Secondary | ICD-10-CM | POA: Diagnosis not present

## 2022-10-10 DIAGNOSIS — R4701 Aphasia: Secondary | ICD-10-CM | POA: Diagnosis not present

## 2022-10-10 DIAGNOSIS — F1090 Alcohol use, unspecified, uncomplicated: Secondary | ICD-10-CM | POA: Diagnosis not present

## 2022-10-10 DIAGNOSIS — R001 Bradycardia, unspecified: Secondary | ICD-10-CM | POA: Diagnosis not present

## 2022-10-10 DIAGNOSIS — R451 Restlessness and agitation: Secondary | ICD-10-CM | POA: Diagnosis not present

## 2022-10-11 DIAGNOSIS — R451 Restlessness and agitation: Secondary | ICD-10-CM | POA: Diagnosis not present

## 2022-10-11 DIAGNOSIS — I4892 Unspecified atrial flutter: Secondary | ICD-10-CM | POA: Diagnosis not present

## 2022-10-11 DIAGNOSIS — I63511 Cerebral infarction due to unspecified occlusion or stenosis of right middle cerebral artery: Secondary | ICD-10-CM | POA: Diagnosis not present

## 2022-10-11 DIAGNOSIS — Z452 Encounter for adjustment and management of vascular access device: Secondary | ICD-10-CM | POA: Diagnosis not present

## 2022-10-11 DIAGNOSIS — F1090 Alcohol use, unspecified, uncomplicated: Secondary | ICD-10-CM | POA: Diagnosis not present

## 2022-10-11 DIAGNOSIS — Z4682 Encounter for fitting and adjustment of non-vascular catheter: Secondary | ICD-10-CM | POA: Diagnosis not present

## 2022-10-11 DIAGNOSIS — R001 Bradycardia, unspecified: Secondary | ICD-10-CM | POA: Diagnosis not present

## 2022-10-12 DIAGNOSIS — R633 Feeding difficulties, unspecified: Secondary | ICD-10-CM | POA: Diagnosis not present

## 2022-10-12 DIAGNOSIS — R001 Bradycardia, unspecified: Secondary | ICD-10-CM | POA: Diagnosis not present

## 2022-10-12 DIAGNOSIS — I4892 Unspecified atrial flutter: Secondary | ICD-10-CM | POA: Diagnosis not present

## 2022-10-12 DIAGNOSIS — R451 Restlessness and agitation: Secondary | ICD-10-CM | POA: Diagnosis not present

## 2022-10-12 DIAGNOSIS — Z4682 Encounter for fitting and adjustment of non-vascular catheter: Secondary | ICD-10-CM | POA: Diagnosis not present

## 2022-10-12 DIAGNOSIS — I63511 Cerebral infarction due to unspecified occlusion or stenosis of right middle cerebral artery: Secondary | ICD-10-CM | POA: Diagnosis not present

## 2022-10-12 DIAGNOSIS — F1090 Alcohol use, unspecified, uncomplicated: Secondary | ICD-10-CM | POA: Diagnosis not present

## 2022-10-13 DIAGNOSIS — F1090 Alcohol use, unspecified, uncomplicated: Secondary | ICD-10-CM | POA: Diagnosis not present

## 2022-10-13 DIAGNOSIS — R001 Bradycardia, unspecified: Secondary | ICD-10-CM | POA: Diagnosis not present

## 2022-10-13 DIAGNOSIS — R451 Restlessness and agitation: Secondary | ICD-10-CM | POA: Diagnosis not present

## 2022-10-13 DIAGNOSIS — I4892 Unspecified atrial flutter: Secondary | ICD-10-CM | POA: Diagnosis not present

## 2022-10-13 DIAGNOSIS — I63511 Cerebral infarction due to unspecified occlusion or stenosis of right middle cerebral artery: Secondary | ICD-10-CM | POA: Diagnosis not present

## 2022-10-14 DIAGNOSIS — Z931 Gastrostomy status: Secondary | ICD-10-CM | POA: Diagnosis not present

## 2022-10-14 DIAGNOSIS — I63511 Cerebral infarction due to unspecified occlusion or stenosis of right middle cerebral artery: Secondary | ICD-10-CM | POA: Diagnosis not present

## 2022-10-14 DIAGNOSIS — R001 Bradycardia, unspecified: Secondary | ICD-10-CM | POA: Diagnosis not present

## 2022-10-14 DIAGNOSIS — R140 Abdominal distension (gaseous): Secondary | ICD-10-CM | POA: Diagnosis not present

## 2022-10-14 DIAGNOSIS — F1090 Alcohol use, unspecified, uncomplicated: Secondary | ICD-10-CM | POA: Diagnosis not present

## 2022-10-14 DIAGNOSIS — I4892 Unspecified atrial flutter: Secondary | ICD-10-CM | POA: Diagnosis not present

## 2022-10-14 DIAGNOSIS — R451 Restlessness and agitation: Secondary | ICD-10-CM | POA: Diagnosis not present

## 2022-10-15 DIAGNOSIS — R001 Bradycardia, unspecified: Secondary | ICD-10-CM | POA: Diagnosis not present

## 2022-10-15 DIAGNOSIS — I4892 Unspecified atrial flutter: Secondary | ICD-10-CM | POA: Diagnosis not present

## 2022-10-15 DIAGNOSIS — R638 Other symptoms and signs concerning food and fluid intake: Secondary | ICD-10-CM | POA: Diagnosis not present

## 2022-10-15 DIAGNOSIS — R451 Restlessness and agitation: Secondary | ICD-10-CM | POA: Diagnosis not present

## 2022-10-15 DIAGNOSIS — F1090 Alcohol use, unspecified, uncomplicated: Secondary | ICD-10-CM | POA: Diagnosis not present

## 2022-10-15 DIAGNOSIS — Z931 Gastrostomy status: Secondary | ICD-10-CM | POA: Diagnosis not present

## 2022-10-15 DIAGNOSIS — I63511 Cerebral infarction due to unspecified occlusion or stenosis of right middle cerebral artery: Secondary | ICD-10-CM | POA: Diagnosis not present

## 2022-10-16 DIAGNOSIS — I63511 Cerebral infarction due to unspecified occlusion or stenosis of right middle cerebral artery: Secondary | ICD-10-CM | POA: Diagnosis not present

## 2022-10-16 DIAGNOSIS — R001 Bradycardia, unspecified: Secondary | ICD-10-CM | POA: Diagnosis not present

## 2022-10-16 DIAGNOSIS — Z931 Gastrostomy status: Secondary | ICD-10-CM | POA: Diagnosis not present

## 2022-10-16 DIAGNOSIS — I4892 Unspecified atrial flutter: Secondary | ICD-10-CM | POA: Diagnosis not present

## 2022-10-16 DIAGNOSIS — F1090 Alcohol use, unspecified, uncomplicated: Secondary | ICD-10-CM | POA: Diagnosis not present

## 2022-10-16 DIAGNOSIS — R451 Restlessness and agitation: Secondary | ICD-10-CM | POA: Diagnosis not present

## 2022-10-17 DIAGNOSIS — R001 Bradycardia, unspecified: Secondary | ICD-10-CM | POA: Diagnosis not present

## 2022-10-17 DIAGNOSIS — I63511 Cerebral infarction due to unspecified occlusion or stenosis of right middle cerebral artery: Secondary | ICD-10-CM | POA: Diagnosis not present

## 2022-10-17 DIAGNOSIS — Z931 Gastrostomy status: Secondary | ICD-10-CM | POA: Diagnosis not present

## 2022-10-17 DIAGNOSIS — R451 Restlessness and agitation: Secondary | ICD-10-CM | POA: Diagnosis not present

## 2022-10-17 DIAGNOSIS — I4892 Unspecified atrial flutter: Secondary | ICD-10-CM | POA: Diagnosis not present

## 2022-10-17 DIAGNOSIS — F1090 Alcohol use, unspecified, uncomplicated: Secondary | ICD-10-CM | POA: Diagnosis not present

## 2022-10-18 DIAGNOSIS — Z931 Gastrostomy status: Secondary | ICD-10-CM | POA: Diagnosis not present

## 2022-10-18 DIAGNOSIS — I63511 Cerebral infarction due to unspecified occlusion or stenosis of right middle cerebral artery: Secondary | ICD-10-CM | POA: Diagnosis not present

## 2022-10-18 DIAGNOSIS — F1090 Alcohol use, unspecified, uncomplicated: Secondary | ICD-10-CM | POA: Diagnosis not present

## 2022-10-18 DIAGNOSIS — R451 Restlessness and agitation: Secondary | ICD-10-CM | POA: Diagnosis not present

## 2022-10-18 DIAGNOSIS — R001 Bradycardia, unspecified: Secondary | ICD-10-CM | POA: Diagnosis not present

## 2022-10-18 DIAGNOSIS — I4892 Unspecified atrial flutter: Secondary | ICD-10-CM | POA: Diagnosis not present

## 2022-10-19 DIAGNOSIS — R001 Bradycardia, unspecified: Secondary | ICD-10-CM | POA: Diagnosis not present

## 2022-10-19 DIAGNOSIS — I63511 Cerebral infarction due to unspecified occlusion or stenosis of right middle cerebral artery: Secondary | ICD-10-CM | POA: Diagnosis not present

## 2022-10-19 DIAGNOSIS — R451 Restlessness and agitation: Secondary | ICD-10-CM | POA: Diagnosis not present

## 2022-10-19 DIAGNOSIS — I4892 Unspecified atrial flutter: Secondary | ICD-10-CM | POA: Diagnosis not present

## 2022-10-19 DIAGNOSIS — F1090 Alcohol use, unspecified, uncomplicated: Secondary | ICD-10-CM | POA: Diagnosis not present

## 2022-10-19 DIAGNOSIS — Z931 Gastrostomy status: Secondary | ICD-10-CM | POA: Diagnosis not present

## 2022-10-20 DIAGNOSIS — R451 Restlessness and agitation: Secondary | ICD-10-CM | POA: Diagnosis not present

## 2022-10-20 DIAGNOSIS — Z931 Gastrostomy status: Secondary | ICD-10-CM | POA: Diagnosis not present

## 2022-10-20 DIAGNOSIS — R4701 Aphasia: Secondary | ICD-10-CM | POA: Diagnosis not present

## 2022-10-20 DIAGNOSIS — F1090 Alcohol use, unspecified, uncomplicated: Secondary | ICD-10-CM | POA: Diagnosis not present

## 2022-10-20 DIAGNOSIS — I4892 Unspecified atrial flutter: Secondary | ICD-10-CM | POA: Diagnosis not present

## 2022-10-20 DIAGNOSIS — I63511 Cerebral infarction due to unspecified occlusion or stenosis of right middle cerebral artery: Secondary | ICD-10-CM | POA: Diagnosis not present

## 2022-10-21 DIAGNOSIS — Z931 Gastrostomy status: Secondary | ICD-10-CM | POA: Diagnosis not present

## 2022-10-28 DIAGNOSIS — Z931 Gastrostomy status: Secondary | ICD-10-CM | POA: Diagnosis not present

## 2022-10-29 DIAGNOSIS — I63311 Cerebral infarction due to thrombosis of right middle cerebral artery: Secondary | ICD-10-CM | POA: Diagnosis not present

## 2022-10-29 DIAGNOSIS — I10 Essential (primary) hypertension: Secondary | ICD-10-CM | POA: Diagnosis not present

## 2022-10-29 DIAGNOSIS — R1030 Lower abdominal pain, unspecified: Secondary | ICD-10-CM | POA: Diagnosis not present

## 2022-10-29 DIAGNOSIS — S72142A Displaced intertrochanteric fracture of left femur, initial encounter for closed fracture: Secondary | ICD-10-CM | POA: Diagnosis not present

## 2022-10-29 DIAGNOSIS — I69391 Dysphagia following cerebral infarction: Secondary | ICD-10-CM | POA: Diagnosis not present

## 2022-10-29 DIAGNOSIS — Z7901 Long term (current) use of anticoagulants: Secondary | ICD-10-CM | POA: Diagnosis not present

## 2022-10-29 DIAGNOSIS — D62 Acute posthemorrhagic anemia: Secondary | ICD-10-CM | POA: Diagnosis not present

## 2022-10-29 DIAGNOSIS — I69354 Hemiplegia and hemiparesis following cerebral infarction affecting left non-dominant side: Secondary | ICD-10-CM | POA: Diagnosis not present

## 2022-10-29 DIAGNOSIS — F5105 Insomnia due to other mental disorder: Secondary | ICD-10-CM | POA: Diagnosis not present

## 2022-10-29 DIAGNOSIS — Z79899 Other long term (current) drug therapy: Secondary | ICD-10-CM | POA: Diagnosis not present

## 2022-10-29 DIAGNOSIS — M6281 Muscle weakness (generalized): Secondary | ICD-10-CM | POA: Diagnosis not present

## 2022-10-29 DIAGNOSIS — I69320 Aphasia following cerebral infarction: Secondary | ICD-10-CM | POA: Diagnosis not present

## 2022-10-29 DIAGNOSIS — S79912A Unspecified injury of left hip, initial encounter: Secondary | ICD-10-CM | POA: Diagnosis not present

## 2022-10-29 DIAGNOSIS — I69318 Other symptoms and signs involving cognitive functions following cerebral infarction: Secondary | ICD-10-CM | POA: Diagnosis not present

## 2022-10-29 DIAGNOSIS — Z931 Gastrostomy status: Secondary | ICD-10-CM | POA: Diagnosis not present

## 2022-10-29 DIAGNOSIS — F39 Unspecified mood [affective] disorder: Secondary | ICD-10-CM | POA: Diagnosis not present

## 2022-10-29 DIAGNOSIS — B182 Chronic viral hepatitis C: Secondary | ICD-10-CM | POA: Diagnosis not present

## 2022-10-29 DIAGNOSIS — R2689 Other abnormalities of gait and mobility: Secondary | ICD-10-CM | POA: Diagnosis not present

## 2022-10-29 DIAGNOSIS — I639 Cerebral infarction, unspecified: Secondary | ICD-10-CM | POA: Diagnosis not present

## 2022-10-29 DIAGNOSIS — R5381 Other malaise: Secondary | ICD-10-CM | POA: Diagnosis not present

## 2022-10-29 DIAGNOSIS — F17210 Nicotine dependence, cigarettes, uncomplicated: Secondary | ICD-10-CM | POA: Diagnosis not present

## 2022-10-29 DIAGNOSIS — M79662 Pain in left lower leg: Secondary | ICD-10-CM | POA: Diagnosis not present

## 2022-10-29 DIAGNOSIS — I69311 Memory deficit following cerebral infarction: Secondary | ICD-10-CM | POA: Diagnosis not present

## 2022-10-29 DIAGNOSIS — I63511 Cerebral infarction due to unspecified occlusion or stenosis of right middle cerebral artery: Secondary | ICD-10-CM | POA: Diagnosis not present

## 2022-10-29 DIAGNOSIS — R339 Retention of urine, unspecified: Secondary | ICD-10-CM | POA: Diagnosis not present

## 2022-10-29 DIAGNOSIS — I4891 Unspecified atrial fibrillation: Secondary | ICD-10-CM | POA: Diagnosis not present

## 2022-10-29 DIAGNOSIS — Z87891 Personal history of nicotine dependence: Secondary | ICD-10-CM | POA: Diagnosis not present

## 2022-10-29 DIAGNOSIS — I499 Cardiac arrhythmia, unspecified: Secondary | ICD-10-CM | POA: Diagnosis not present

## 2022-10-29 DIAGNOSIS — F01511 Vascular dementia, unspecified severity, with agitation: Secondary | ICD-10-CM | POA: Diagnosis not present

## 2022-10-29 DIAGNOSIS — Z01818 Encounter for other preprocedural examination: Secondary | ICD-10-CM | POA: Diagnosis not present

## 2022-10-29 DIAGNOSIS — S299XXA Unspecified injury of thorax, initial encounter: Secondary | ICD-10-CM | POA: Diagnosis not present

## 2022-10-29 DIAGNOSIS — Z043 Encounter for examination and observation following other accident: Secondary | ICD-10-CM | POA: Diagnosis not present

## 2022-10-29 DIAGNOSIS — J189 Pneumonia, unspecified organism: Secondary | ICD-10-CM | POA: Diagnosis not present

## 2022-10-29 DIAGNOSIS — F411 Generalized anxiety disorder: Secondary | ICD-10-CM | POA: Diagnosis not present

## 2022-10-29 DIAGNOSIS — K9423 Gastrostomy malfunction: Secondary | ICD-10-CM | POA: Diagnosis not present

## 2022-10-29 DIAGNOSIS — W19XXXA Unspecified fall, initial encounter: Secondary | ICD-10-CM | POA: Diagnosis not present

## 2022-10-29 DIAGNOSIS — I69322 Dysarthria following cerebral infarction: Secondary | ICD-10-CM | POA: Diagnosis not present

## 2022-10-29 DIAGNOSIS — M47812 Spondylosis without myelopathy or radiculopathy, cervical region: Secondary | ICD-10-CM | POA: Diagnosis not present

## 2022-10-29 DIAGNOSIS — F03C0 Unspecified dementia, severe, without behavioral disturbance, psychotic disturbance, mood disturbance, and anxiety: Secondary | ICD-10-CM | POA: Diagnosis not present

## 2022-10-29 DIAGNOSIS — R410 Disorientation, unspecified: Secondary | ICD-10-CM | POA: Diagnosis not present

## 2022-10-29 DIAGNOSIS — Z4682 Encounter for fitting and adjustment of non-vascular catheter: Secondary | ICD-10-CM | POA: Diagnosis not present

## 2022-10-29 DIAGNOSIS — S8992XA Unspecified injury of left lower leg, initial encounter: Secondary | ICD-10-CM | POA: Diagnosis not present

## 2022-10-29 DIAGNOSIS — F1090 Alcohol use, unspecified, uncomplicated: Secondary | ICD-10-CM | POA: Diagnosis not present

## 2022-10-29 DIAGNOSIS — Z431 Encounter for attention to gastrostomy: Secondary | ICD-10-CM | POA: Diagnosis not present

## 2022-10-29 DIAGNOSIS — I4892 Unspecified atrial flutter: Secondary | ICD-10-CM | POA: Diagnosis not present

## 2022-10-29 DIAGNOSIS — E870 Hyperosmolality and hypernatremia: Secondary | ICD-10-CM | POA: Diagnosis not present

## 2022-10-29 DIAGNOSIS — J9811 Atelectasis: Secondary | ICD-10-CM | POA: Diagnosis not present

## 2022-10-29 DIAGNOSIS — F329 Major depressive disorder, single episode, unspecified: Secondary | ICD-10-CM | POA: Diagnosis not present

## 2022-11-01 DIAGNOSIS — R5381 Other malaise: Secondary | ICD-10-CM | POA: Diagnosis not present

## 2022-11-03 DIAGNOSIS — R5381 Other malaise: Secondary | ICD-10-CM | POA: Diagnosis not present

## 2022-11-05 DIAGNOSIS — R5381 Other malaise: Secondary | ICD-10-CM | POA: Diagnosis not present

## 2022-11-09 DIAGNOSIS — Z7901 Long term (current) use of anticoagulants: Secondary | ICD-10-CM | POA: Diagnosis not present

## 2022-11-09 DIAGNOSIS — Z931 Gastrostomy status: Secondary | ICD-10-CM | POA: Diagnosis not present

## 2022-11-09 DIAGNOSIS — I639 Cerebral infarction, unspecified: Secondary | ICD-10-CM | POA: Diagnosis not present

## 2022-11-09 DIAGNOSIS — Z043 Encounter for examination and observation following other accident: Secondary | ICD-10-CM | POA: Diagnosis not present

## 2022-11-09 DIAGNOSIS — Z87891 Personal history of nicotine dependence: Secondary | ICD-10-CM | POA: Diagnosis not present

## 2022-11-09 DIAGNOSIS — Z431 Encounter for attention to gastrostomy: Secondary | ICD-10-CM | POA: Diagnosis not present

## 2022-11-09 DIAGNOSIS — K9423 Gastrostomy malfunction: Secondary | ICD-10-CM | POA: Diagnosis not present

## 2022-11-09 DIAGNOSIS — F17210 Nicotine dependence, cigarettes, uncomplicated: Secondary | ICD-10-CM | POA: Diagnosis not present

## 2022-11-09 DIAGNOSIS — Z79899 Other long term (current) drug therapy: Secondary | ICD-10-CM | POA: Diagnosis not present

## 2022-11-09 DIAGNOSIS — I10 Essential (primary) hypertension: Secondary | ICD-10-CM | POA: Diagnosis not present

## 2022-11-09 DIAGNOSIS — F03C0 Unspecified dementia, severe, without behavioral disturbance, psychotic disturbance, mood disturbance, and anxiety: Secondary | ICD-10-CM | POA: Diagnosis not present

## 2022-11-10 DIAGNOSIS — R5381 Other malaise: Secondary | ICD-10-CM | POA: Diagnosis not present

## 2022-11-12 DIAGNOSIS — R5381 Other malaise: Secondary | ICD-10-CM | POA: Diagnosis not present

## 2022-11-17 DIAGNOSIS — R5381 Other malaise: Secondary | ICD-10-CM | POA: Diagnosis not present

## 2022-11-24 DIAGNOSIS — R5381 Other malaise: Secondary | ICD-10-CM | POA: Diagnosis not present

## 2022-11-25 DIAGNOSIS — F329 Major depressive disorder, single episode, unspecified: Secondary | ICD-10-CM | POA: Diagnosis not present

## 2022-11-25 DIAGNOSIS — F39 Unspecified mood [affective] disorder: Secondary | ICD-10-CM | POA: Diagnosis not present

## 2022-11-25 DIAGNOSIS — F5105 Insomnia due to other mental disorder: Secondary | ICD-10-CM | POA: Diagnosis not present

## 2022-11-25 DIAGNOSIS — F411 Generalized anxiety disorder: Secondary | ICD-10-CM | POA: Diagnosis not present

## 2022-11-26 DIAGNOSIS — R5381 Other malaise: Secondary | ICD-10-CM | POA: Diagnosis not present

## 2022-11-27 DIAGNOSIS — J9811 Atelectasis: Secondary | ICD-10-CM | POA: Diagnosis not present

## 2022-11-27 DIAGNOSIS — J189 Pneumonia, unspecified organism: Secondary | ICD-10-CM | POA: Diagnosis not present

## 2022-11-27 DIAGNOSIS — S72142D Displaced intertrochanteric fracture of left femur, subsequent encounter for closed fracture with routine healing: Secondary | ICD-10-CM | POA: Diagnosis not present

## 2022-11-27 DIAGNOSIS — I69311 Memory deficit following cerebral infarction: Secondary | ICD-10-CM | POA: Diagnosis not present

## 2022-11-27 DIAGNOSIS — I4892 Unspecified atrial flutter: Secondary | ICD-10-CM | POA: Diagnosis not present

## 2022-11-27 DIAGNOSIS — D62 Acute posthemorrhagic anemia: Secondary | ICD-10-CM | POA: Diagnosis not present

## 2022-11-27 DIAGNOSIS — S79912A Unspecified injury of left hip, initial encounter: Secondary | ICD-10-CM | POA: Diagnosis not present

## 2022-11-27 DIAGNOSIS — Z043 Encounter for examination and observation following other accident: Secondary | ICD-10-CM | POA: Diagnosis not present

## 2022-11-27 DIAGNOSIS — Z720 Tobacco use: Secondary | ICD-10-CM | POA: Diagnosis not present

## 2022-11-27 DIAGNOSIS — R531 Weakness: Secondary | ICD-10-CM | POA: Diagnosis not present

## 2022-11-27 DIAGNOSIS — I69391 Dysphagia following cerebral infarction: Secondary | ICD-10-CM | POA: Diagnosis not present

## 2022-11-27 DIAGNOSIS — Z7901 Long term (current) use of anticoagulants: Secondary | ICD-10-CM | POA: Diagnosis not present

## 2022-11-27 DIAGNOSIS — I4891 Unspecified atrial fibrillation: Secondary | ICD-10-CM | POA: Diagnosis not present

## 2022-11-27 DIAGNOSIS — B1920 Unspecified viral hepatitis C without hepatic coma: Secondary | ICD-10-CM | POA: Diagnosis not present

## 2022-11-27 DIAGNOSIS — M47812 Spondylosis without myelopathy or radiculopathy, cervical region: Secondary | ICD-10-CM | POA: Diagnosis not present

## 2022-11-27 DIAGNOSIS — I69354 Hemiplegia and hemiparesis following cerebral infarction affecting left non-dominant side: Secondary | ICD-10-CM | POA: Diagnosis not present

## 2022-11-27 DIAGNOSIS — S72142A Displaced intertrochanteric fracture of left femur, initial encounter for closed fracture: Secondary | ICD-10-CM | POA: Diagnosis not present

## 2022-11-27 DIAGNOSIS — M80052A Age-related osteoporosis with current pathological fracture, left femur, initial encounter for fracture: Secondary | ICD-10-CM | POA: Diagnosis not present

## 2022-11-27 DIAGNOSIS — M6250 Muscle wasting and atrophy, not elsewhere classified, unspecified site: Secondary | ICD-10-CM | POA: Diagnosis not present

## 2022-11-27 DIAGNOSIS — Z931 Gastrostomy status: Secondary | ICD-10-CM | POA: Diagnosis not present

## 2022-11-27 DIAGNOSIS — R944 Abnormal results of kidney function studies: Secondary | ICD-10-CM | POA: Diagnosis not present

## 2022-11-27 DIAGNOSIS — F01511 Vascular dementia, unspecified severity, with agitation: Secondary | ICD-10-CM | POA: Diagnosis not present

## 2022-11-27 DIAGNOSIS — I480 Paroxysmal atrial fibrillation: Secondary | ICD-10-CM | POA: Diagnosis not present

## 2022-11-27 DIAGNOSIS — R509 Fever, unspecified: Secondary | ICD-10-CM | POA: Diagnosis not present

## 2022-11-27 DIAGNOSIS — R059 Cough, unspecified: Secondary | ICD-10-CM | POA: Diagnosis not present

## 2022-11-27 DIAGNOSIS — I9581 Postprocedural hypotension: Secondary | ICD-10-CM | POA: Diagnosis not present

## 2022-11-27 DIAGNOSIS — E870 Hyperosmolality and hypernatremia: Secondary | ICD-10-CM | POA: Diagnosis not present

## 2022-11-27 DIAGNOSIS — R000 Tachycardia, unspecified: Secondary | ICD-10-CM | POA: Diagnosis not present

## 2022-11-27 DIAGNOSIS — I499 Cardiac arrhythmia, unspecified: Secondary | ICD-10-CM | POA: Diagnosis not present

## 2022-11-27 DIAGNOSIS — Z9989 Dependence on other enabling machines and devices: Secondary | ICD-10-CM | POA: Diagnosis not present

## 2022-11-27 DIAGNOSIS — W19XXXA Unspecified fall, initial encounter: Secondary | ICD-10-CM | POA: Diagnosis not present

## 2022-11-27 DIAGNOSIS — I77819 Aortic ectasia, unspecified site: Secondary | ICD-10-CM | POA: Diagnosis not present

## 2022-11-27 DIAGNOSIS — Z8673 Personal history of transient ischemic attack (TIA), and cerebral infarction without residual deficits: Secondary | ICD-10-CM | POA: Diagnosis not present

## 2022-11-27 DIAGNOSIS — S8992XA Unspecified injury of left lower leg, initial encounter: Secondary | ICD-10-CM | POA: Diagnosis not present

## 2022-11-27 DIAGNOSIS — Z8679 Personal history of other diseases of the circulatory system: Secondary | ICD-10-CM | POA: Diagnosis not present

## 2022-11-27 DIAGNOSIS — Z01818 Encounter for other preprocedural examination: Secondary | ICD-10-CM | POA: Diagnosis not present

## 2022-11-27 DIAGNOSIS — K9429 Other complications of gastrostomy: Secondary | ICD-10-CM | POA: Diagnosis not present

## 2022-11-27 DIAGNOSIS — R339 Retention of urine, unspecified: Secondary | ICD-10-CM | POA: Diagnosis not present

## 2022-11-27 DIAGNOSIS — Z4682 Encounter for fitting and adjustment of non-vascular catheter: Secondary | ICD-10-CM | POA: Diagnosis not present

## 2022-11-27 DIAGNOSIS — I6350 Cerebral infarction due to unspecified occlusion or stenosis of unspecified cerebral artery: Secondary | ICD-10-CM | POA: Diagnosis not present

## 2022-11-27 DIAGNOSIS — K5900 Constipation, unspecified: Secondary | ICD-10-CM | POA: Diagnosis not present

## 2022-11-28 DIAGNOSIS — S72142A Displaced intertrochanteric fracture of left femur, initial encounter for closed fracture: Secondary | ICD-10-CM | POA: Diagnosis not present

## 2022-11-29 DIAGNOSIS — S72142D Displaced intertrochanteric fracture of left femur, subsequent encounter for closed fracture with routine healing: Secondary | ICD-10-CM | POA: Diagnosis not present

## 2022-12-30 DIAGNOSIS — L89526 Pressure-induced deep tissue damage of left ankle: Secondary | ICD-10-CM | POA: Diagnosis not present

## 2022-12-30 DIAGNOSIS — F03918 Unspecified dementia, unspecified severity, with other behavioral disturbance: Secondary | ICD-10-CM | POA: Diagnosis not present

## 2022-12-30 DIAGNOSIS — I639 Cerebral infarction, unspecified: Secondary | ICD-10-CM | POA: Diagnosis not present

## 2022-12-31 DIAGNOSIS — Z79899 Other long term (current) drug therapy: Secondary | ICD-10-CM | POA: Diagnosis not present

## 2022-12-31 DIAGNOSIS — R0902 Hypoxemia: Secondary | ICD-10-CM | POA: Diagnosis not present

## 2022-12-31 DIAGNOSIS — I4891 Unspecified atrial fibrillation: Secondary | ICD-10-CM | POA: Diagnosis not present

## 2022-12-31 DIAGNOSIS — I480 Paroxysmal atrial fibrillation: Secondary | ICD-10-CM | POA: Diagnosis not present

## 2022-12-31 DIAGNOSIS — R456 Violent behavior: Secondary | ICD-10-CM | POA: Diagnosis not present

## 2022-12-31 DIAGNOSIS — Z4789 Encounter for other orthopedic aftercare: Secondary | ICD-10-CM | POA: Diagnosis not present

## 2022-12-31 DIAGNOSIS — R000 Tachycardia, unspecified: Secondary | ICD-10-CM | POA: Diagnosis not present

## 2022-12-31 DIAGNOSIS — R799 Abnormal finding of blood chemistry, unspecified: Secondary | ICD-10-CM | POA: Diagnosis not present

## 2023-01-01 DIAGNOSIS — R4182 Altered mental status, unspecified: Secondary | ICD-10-CM | POA: Diagnosis not present

## 2023-01-01 DIAGNOSIS — R531 Weakness: Secondary | ICD-10-CM | POA: Diagnosis not present

## 2023-01-01 DIAGNOSIS — M6250 Muscle wasting and atrophy, not elsewhere classified, unspecified site: Secondary | ICD-10-CM | POA: Diagnosis not present

## 2023-01-01 DIAGNOSIS — Z9989 Dependence on other enabling machines and devices: Secondary | ICD-10-CM | POA: Diagnosis not present

## 2023-01-06 DIAGNOSIS — F03918 Unspecified dementia, unspecified severity, with other behavioral disturbance: Secondary | ICD-10-CM | POA: Diagnosis not present

## 2023-01-06 DIAGNOSIS — I639 Cerebral infarction, unspecified: Secondary | ICD-10-CM | POA: Diagnosis not present

## 2023-01-06 DIAGNOSIS — L89616 Pressure-induced deep tissue damage of right heel: Secondary | ICD-10-CM | POA: Diagnosis not present

## 2023-01-20 DIAGNOSIS — F03918 Unspecified dementia, unspecified severity, with other behavioral disturbance: Secondary | ICD-10-CM | POA: Diagnosis not present

## 2023-01-20 DIAGNOSIS — I639 Cerebral infarction, unspecified: Secondary | ICD-10-CM | POA: Diagnosis not present

## 2023-01-20 DIAGNOSIS — L89616 Pressure-induced deep tissue damage of right heel: Secondary | ICD-10-CM | POA: Diagnosis not present

## 2023-01-21 DIAGNOSIS — Z79899 Other long term (current) drug therapy: Secondary | ICD-10-CM | POA: Diagnosis not present

## 2023-01-21 DIAGNOSIS — K9420 Gastrostomy complication, unspecified: Secondary | ICD-10-CM | POA: Diagnosis not present

## 2023-01-21 DIAGNOSIS — Z7401 Bed confinement status: Secondary | ICD-10-CM | POA: Diagnosis not present

## 2023-01-21 DIAGNOSIS — R69 Illness, unspecified: Secondary | ICD-10-CM | POA: Diagnosis not present

## 2023-01-21 DIAGNOSIS — K9423 Gastrostomy malfunction: Secondary | ICD-10-CM | POA: Diagnosis not present

## 2023-01-21 DIAGNOSIS — Z4682 Encounter for fitting and adjustment of non-vascular catheter: Secondary | ICD-10-CM | POA: Diagnosis not present

## 2023-01-21 DIAGNOSIS — R000 Tachycardia, unspecified: Secondary | ICD-10-CM | POA: Diagnosis not present

## 2023-02-01 ENCOUNTER — Other Ambulatory Visit: Payer: Self-pay

## 2023-02-01 ENCOUNTER — Emergency Department (HOSPITAL_COMMUNITY)
Admission: EM | Admit: 2023-02-01 | Discharge: 2023-02-01 | Disposition: A | Discharge status: Home / Self Care | Payer: No Typology Code available for payment source | Attending: Emergency Medicine | Admitting: Emergency Medicine

## 2023-02-01 ENCOUNTER — Encounter (HOSPITAL_COMMUNITY): Payer: Self-pay

## 2023-02-01 ENCOUNTER — Emergency Department (HOSPITAL_COMMUNITY): Payer: No Typology Code available for payment source

## 2023-02-01 DIAGNOSIS — K9423 Gastrostomy malfunction: Secondary | ICD-10-CM

## 2023-02-01 MED ORDER — IOHEXOL 300 MG/ML  SOLN: 50.0000 mL | Freq: Once | INTRAMUSCULAR | Status: DC | PRN

## 2023-02-01 NOTE — Discharge Instructions (Signed)
Your PEG tube was replaced with a Foley catheter tubing to keep the lumen open.  We were unable to place the PEG tube in the emergency department.  This will need to be replaced by interventional radiology.  You can either reach out to the interventional radiology team that placed this last time at [facility] or call the [facility] radiology team.  Follow-up with your primary care provider.

## 2023-02-01 NOTE — ED Provider Notes (Signed)
[place] EMERGENCY DEPARTMENT AT [facility] Provider Note   CSN: [id number] Arrival date & time: 02/01/23  1659     History  Chief Complaint  Patient presents with   Pull peg tube    Pt pulled out peg tube    SHERIDAN GETTEL is a 77 y.o. male.  Patient presents from nursing home for evaluation of PEG tube displacement.  Patient is disoriented and cannot provide any history.  Nurse states SNF is not answering the phone.  Extensive chart review done.  Was able to identify that the PEG tube that was most recently placed 5/22 during admission at [facility] was an 36 [place].  The history is provided by medical records.       Home Medications Prior to Admission medications   Not on File      Allergies    Alka-seltzer original [aspirin effervescent]    Review of Systems   Review of Systems  Constitutional:  Negative for fever.  All other systems reviewed and are negative.   Physical Exam Updated Vital Signs BP 119/75 (BP Location: Right Arm)   Temp 98.8 F (37.1 C) (Axillary)   Ht 6\' 1"  (1.854 m)   Wt 127 kg   SpO2 98%   BMI 36.94 kg/m  Physical Exam Vitals and nursing note reviewed.  Constitutional:      General: He is not in acute distress.    Appearance: Normal appearance. He is not ill-appearing.  HENT:     Head: Normocephalic and atraumatic.     Nose: Nose normal.  Eyes:     Conjunctiva/sclera: Conjunctivae normal.  Cardiovascular:     Rate and Rhythm: Normal rate.  Pulmonary:     Effort: Pulmonary effort is normal. No respiratory distress.  Abdominal:     Palpations: Abdomen is soft.     Comments: Area of PEG tube site noted.  No signs of infection.  Musculoskeletal:        General: No deformity.  Skin:    Findings: No rash.  Neurological:     Mental Status: He is alert.     ED Results / Procedures / Treatments   Labs (all labs ordered are listed, but only abnormal results are displayed) Labs Reviewed - No data to  display  EKG None  Radiology No results found.  Procedures Procedures    Medications Ordered in ED Medications - No data to display  ED Course/ Medical Decision Making/ A&P Clinical Course as of 02/01/23 2204  Mon Feb 01, 2023  9743 77 year old male sent in for dislodged PEG tube.  Patient unable to give any history.  We were able to place a Foley catheter.  Will check tube study.  If appropriately located patient can be returned back to his facility to have the catheter switched over to a PEG tube by interventional radiology in the future. [MB]    Clinical Course User Index [MB] Terrilee Files, MD                             Medical Decision Making Amount and/or Complexity of Data Reviewed Radiology: ordered.  Risk Prescription drug management.   Patient presents from nursing home.  PEG tube was removed at the nursing home by patient.  No active bleeding.  No signs of infection on exam.  Unable to replace this with PEG tube.  The only size available in the emergency department was 20 [place].  We also checked the OR.  I do not have other sizes available.  Unsuccessful at placing the 20 French PEG tube.  Attempted to place multiple sizes of a Foley catheter to keep the lumen open.  Ultimately my attending was able to place a 14 French Foley catheter to keep the lumen open.  PEG tube study obtained and shows appropriate location.  Will discharge patient back to SNF.  IR will need to be involved to replace the PEG tube.  Facility will need to reach out to interventional radiology to set this up.  Patient discharged in stable condition.  Plan discussed with attending.  Final Clinical Impression(s) / ED Diagnoses Final diagnoses:  PEG tube malfunction [facility])    Rx / DC Orders ED Discharge Orders     None         Marita [place], PA-C 02/01/23 2207    Terrilee Files, MD 02/02/23 [phone]

## 2023-02-01 NOTE — ED Notes (Signed)
Spoke with nurse at [facility] regarding patient. Per nurse, peg tube was

## 2023-02-01 NOTE — ED Notes (Signed)
Notified [place] [facility] of patient needing transportation back to [facility]. 

## 2023-02-01 NOTE — ED Notes (Signed)
Attempted to call report to facility w/o answer

## 2023-02-01 NOTE — ED Notes (Signed)
71F foley inserted by MD butler w/o any difficult. 10ml Balloon

## 2023-02-01 NOTE — ED Triage Notes (Signed)
Pt transported by [facility] from [facility] [place] d/t pulling out PEG tube. Pt disoriented x4 at baseline.

## 2023-02-01 NOTE — ED Notes (Signed)
Attempted to call [facility] for report

## 2023-02-01 NOTE — ED Notes (Signed)
Report called to sandy at [facility].   Sandy asked to call ems for transport back

## 2023-02-05 DIAGNOSIS — F411 Generalized anxiety disorder: Secondary | ICD-10-CM | POA: Diagnosis not present

## 2023-02-05 DIAGNOSIS — F02C2 Dementia in other diseases classified elsewhere, severe, with psychotic disturbance: Secondary | ICD-10-CM | POA: Diagnosis not present

## 2023-02-10 DIAGNOSIS — L89616 Pressure-induced deep tissue damage of right heel: Secondary | ICD-10-CM | POA: Diagnosis not present

## 2023-02-10 DIAGNOSIS — I639 Cerebral infarction, unspecified: Secondary | ICD-10-CM | POA: Diagnosis not present

## 2023-02-10 DIAGNOSIS — F03918 Unspecified dementia, unspecified severity, with other behavioral disturbance: Secondary | ICD-10-CM | POA: Diagnosis not present

## 2023-02-11 DIAGNOSIS — I639 Cerebral infarction, unspecified: Secondary | ICD-10-CM | POA: Diagnosis not present

## 2023-02-11 DIAGNOSIS — I10 Essential (primary) hypertension: Secondary | ICD-10-CM | POA: Diagnosis not present

## 2023-02-11 DIAGNOSIS — F03911 Unspecified dementia, unspecified severity, with agitation: Secondary | ICD-10-CM | POA: Diagnosis not present

## 2023-02-11 DIAGNOSIS — F419 Anxiety disorder, unspecified: Secondary | ICD-10-CM | POA: Diagnosis not present

## 2023-02-11 DIAGNOSIS — Z4659 Encounter for fitting and adjustment of other gastrointestinal appliance and device: Secondary | ICD-10-CM | POA: Diagnosis not present

## 2023-02-11 DIAGNOSIS — R1310 Dysphagia, unspecified: Secondary | ICD-10-CM | POA: Diagnosis not present

## 2023-02-11 DIAGNOSIS — G4733 Obstructive sleep apnea (adult) (pediatric): Secondary | ICD-10-CM | POA: Diagnosis not present

## 2023-02-11 DIAGNOSIS — I4891 Unspecified atrial fibrillation: Secondary | ICD-10-CM | POA: Diagnosis not present

## 2023-02-11 DIAGNOSIS — R4701 Aphasia: Secondary | ICD-10-CM | POA: Diagnosis not present

## 2023-02-11 DIAGNOSIS — B182 Chronic viral hepatitis C: Secondary | ICD-10-CM | POA: Diagnosis not present

## 2023-03-11 DIAGNOSIS — Z66 Do not resuscitate: Secondary | ICD-10-CM | POA: Diagnosis not present

## 2023-03-11 DIAGNOSIS — Z431 Encounter for attention to gastrostomy: Secondary | ICD-10-CM | POA: Diagnosis not present

## 2023-03-11 DIAGNOSIS — Z7401 Bed confinement status: Secondary | ICD-10-CM | POA: Diagnosis not present

## 2023-03-11 DIAGNOSIS — R6889 Other general symptoms and signs: Secondary | ICD-10-CM | POA: Diagnosis not present

## 2023-03-11 DIAGNOSIS — R456 Violent behavior: Secondary | ICD-10-CM | POA: Diagnosis not present

## 2023-03-11 DIAGNOSIS — K9423 Gastrostomy malfunction: Secondary | ICD-10-CM | POA: Diagnosis not present

## 2023-03-21 DEATH — deceased
